# Patient Record
Sex: Female | Born: 1955 | Hispanic: No | Marital: Married | Smoking: Never smoker
Health system: Southern US, Community
[De-identification: ages and names within clinical notes are randomized; demographics above are authoritative.]

## PROBLEM LIST (undated history)

## (undated) DIAGNOSIS — I10 Essential (primary) hypertension: Secondary | ICD-10-CM

## (undated) DIAGNOSIS — I6529 Occlusion and stenosis of unspecified carotid artery: Secondary | ICD-10-CM

## (undated) DIAGNOSIS — Z8371 Family history of colonic polyps: Secondary | ICD-10-CM

## (undated) DIAGNOSIS — E119 Type 2 diabetes mellitus without complications: Secondary | ICD-10-CM

## (undated) DIAGNOSIS — Z83719 Family history of colon polyps, unspecified: Secondary | ICD-10-CM

## (undated) DIAGNOSIS — D649 Anemia, unspecified: Secondary | ICD-10-CM

## (undated) HISTORY — PX: CAROTID STENT INSERTION: SHX5766

## (undated) HISTORY — PX: OVARIAN CYST REMOVAL: SHX89

## (undated) HISTORY — DX: Occlusion and stenosis of unspecified carotid artery: I65.29

## (undated) HISTORY — DX: Family history of colon polyps, unspecified: Z83.719

## (undated) HISTORY — DX: Family history of colonic polyps: Z83.71

---

## 2002-02-26 ENCOUNTER — Other Ambulatory Visit: Admission: RE | Admit: 2002-02-26 | Discharge: 2002-02-26 | Payer: Self-pay | Admitting: Obstetrics and Gynecology

## 2014-10-27 HISTORY — PX: COLONOSCOPY: SHX174

## 2017-07-17 ENCOUNTER — Ambulatory Visit: Payer: Self-pay | Admitting: Allergy

## 2020-02-14 ENCOUNTER — Encounter (HOSPITAL_COMMUNITY): Payer: Self-pay | Admitting: Emergency Medicine

## 2020-02-14 ENCOUNTER — Inpatient Hospital Stay (HOSPITAL_COMMUNITY)
Admission: EM | Admit: 2020-02-14 | Discharge: 2020-02-18 | DRG: 811 | Disposition: A | Discharge status: Home / Self Care | Payer: Medicare Other | Attending: Family Medicine | Admitting: Family Medicine

## 2020-02-14 ENCOUNTER — Other Ambulatory Visit: Payer: Self-pay

## 2020-02-14 DIAGNOSIS — K5731 Diverticulosis of large intestine without perforation or abscess with bleeding: Secondary | ICD-10-CM | POA: Diagnosis present

## 2020-02-14 DIAGNOSIS — Z7902 Long term (current) use of antithrombotics/antiplatelets: Secondary | ICD-10-CM | POA: Diagnosis not present

## 2020-02-14 DIAGNOSIS — Z955 Presence of coronary angioplasty implant and graft: Secondary | ICD-10-CM

## 2020-02-14 DIAGNOSIS — I6521 Occlusion and stenosis of right carotid artery: Secondary | ICD-10-CM | POA: Diagnosis not present

## 2020-02-14 DIAGNOSIS — E119 Type 2 diabetes mellitus without complications: Secondary | ICD-10-CM | POA: Diagnosis not present

## 2020-02-14 DIAGNOSIS — K648 Other hemorrhoids: Secondary | ICD-10-CM | POA: Diagnosis present

## 2020-02-14 DIAGNOSIS — N179 Acute kidney failure, unspecified: Secondary | ICD-10-CM | POA: Diagnosis present

## 2020-02-14 DIAGNOSIS — E785 Hyperlipidemia, unspecified: Secondary | ICD-10-CM | POA: Diagnosis present

## 2020-02-14 DIAGNOSIS — Z7984 Long term (current) use of oral hypoglycemic drugs: Secondary | ICD-10-CM | POA: Diagnosis not present

## 2020-02-14 DIAGNOSIS — E869 Volume depletion, unspecified: Secondary | ICD-10-CM | POA: Diagnosis present

## 2020-02-14 DIAGNOSIS — Z888 Allergy status to other drugs, medicaments and biological substances status: Secondary | ICD-10-CM

## 2020-02-14 DIAGNOSIS — Z885 Allergy status to narcotic agent status: Secondary | ICD-10-CM

## 2020-02-14 DIAGNOSIS — R195 Other fecal abnormalities: Secondary | ICD-10-CM

## 2020-02-14 DIAGNOSIS — Z6841 Body Mass Index (BMI) 40.0 and over, adult: Secondary | ICD-10-CM

## 2020-02-14 DIAGNOSIS — D75839 Thrombocytosis, unspecified: Secondary | ICD-10-CM | POA: Diagnosis present

## 2020-02-14 DIAGNOSIS — I2510 Atherosclerotic heart disease of native coronary artery without angina pectoris: Secondary | ICD-10-CM | POA: Insufficient documentation

## 2020-02-14 DIAGNOSIS — D62 Acute posthemorrhagic anemia: Secondary | ICD-10-CM | POA: Diagnosis present

## 2020-02-14 DIAGNOSIS — D122 Benign neoplasm of ascending colon: Secondary | ICD-10-CM

## 2020-02-14 DIAGNOSIS — Z7982 Long term (current) use of aspirin: Secondary | ICD-10-CM

## 2020-02-14 DIAGNOSIS — I10 Essential (primary) hypertension: Secondary | ICD-10-CM | POA: Diagnosis present

## 2020-02-14 DIAGNOSIS — Z79899 Other long term (current) drug therapy: Secondary | ICD-10-CM

## 2020-02-14 DIAGNOSIS — K5520 Angiodysplasia of colon without hemorrhage: Secondary | ICD-10-CM | POA: Diagnosis not present

## 2020-02-14 DIAGNOSIS — Z20822 Contact with and (suspected) exposure to covid-19: Secondary | ICD-10-CM | POA: Diagnosis present

## 2020-02-14 DIAGNOSIS — Z8371 Family history of colonic polyps: Secondary | ICD-10-CM | POA: Diagnosis not present

## 2020-02-14 DIAGNOSIS — K5521 Angiodysplasia of colon with hemorrhage: Secondary | ICD-10-CM | POA: Diagnosis present

## 2020-02-14 DIAGNOSIS — D500 Iron deficiency anemia secondary to blood loss (chronic): Secondary | ICD-10-CM

## 2020-02-14 DIAGNOSIS — D509 Iron deficiency anemia, unspecified: Secondary | ICD-10-CM | POA: Diagnosis not present

## 2020-02-14 DIAGNOSIS — D649 Anemia, unspecified: Secondary | ICD-10-CM | POA: Diagnosis not present

## 2020-02-14 DIAGNOSIS — K921 Melena: Secondary | ICD-10-CM | POA: Diagnosis present

## 2020-02-14 DIAGNOSIS — K635 Polyp of colon: Secondary | ICD-10-CM | POA: Diagnosis present

## 2020-02-14 HISTORY — DX: Essential (primary) hypertension: I10

## 2020-02-14 HISTORY — DX: Type 2 diabetes mellitus without complications: E11.9

## 2020-02-14 HISTORY — DX: Anemia, unspecified: D64.9

## 2020-02-14 LAB — COMPREHENSIVE METABOLIC PANEL
ALT: 17 U/L (ref 0–44)
AST: 22 U/L (ref 15–41)
Albumin: 3.7 g/dL (ref 3.5–5.0)
Alkaline Phosphatase: 63 U/L (ref 38–126)
Anion gap: 15 (ref 5–15)
BUN: 10 mg/dL (ref 8–23)
CO2: 20 mmol/L — ABNORMAL LOW (ref 22–32)
Calcium: 9.2 mg/dL (ref 8.9–10.3)
Chloride: 104 mmol/L (ref 98–111)
Creatinine, Ser: 1.02 mg/dL — ABNORMAL HIGH (ref 0.44–1.00)
GFR calc Af Amer: >60 mL/min (ref >60)
GFR calc non Af Amer: 58 mL/min — ABNORMAL LOW (ref >60)
Glucose, Bld: 141 mg/dL — ABNORMAL HIGH (ref 70–99)
Potassium: 3.9 mmol/L (ref 3.5–5.1)
Sodium: 139 mmol/L (ref 135–145)
Total Bilirubin: 0.5 mg/dL (ref 0.3–1.2)
Total Protein: 7.3 g/dL (ref 6.5–8.1)

## 2020-02-14 LAB — CBC
HCT: 28.2 % — ABNORMAL LOW (ref 36.0–46.0)
Hemoglobin: 7.9 g/dL — ABNORMAL LOW (ref 12.0–15.0)
MCH: 23.6 pg — ABNORMAL LOW (ref 26.0–34.0)
MCHC: 28 g/dL — ABNORMAL LOW (ref 30.0–36.0)
MCV: 84.2 fL (ref 80.0–100.0)
Platelets: 549 10*3/uL — ABNORMAL HIGH (ref 150–400)
RBC: 3.35 MIL/uL — ABNORMAL LOW (ref 3.87–5.11)
RDW: 16.4 % — ABNORMAL HIGH (ref 11.5–15.5)
WBC: 7.7 10*3/uL (ref 4.0–10.5)
nRBC: 0 % (ref 0.0–0.2)

## 2020-02-14 LAB — URINALYSIS, ROUTINE W REFLEX MICROSCOPIC
Bilirubin Urine: NEGATIVE
Glucose, UA: NEGATIVE mg/dL
Hgb urine dipstick: NEGATIVE
Ketones, ur: NEGATIVE mg/dL
Leukocytes,Ua: NEGATIVE
Nitrite: NEGATIVE
Protein, ur: NEGATIVE mg/dL
Specific Gravity, Urine: 1.006 (ref 1.005–1.030)
pH: 6 (ref 5.0–8.0)

## 2020-02-14 LAB — IRON AND TIBC
Iron: 12 ug/dL — ABNORMAL LOW (ref 28–170)
Saturation Ratios: 3 % — ABNORMAL LOW (ref 10.4–31.8)
TIBC: 431 ug/dL (ref 250–450)
UIBC: 419 ug/dL

## 2020-02-14 LAB — GLUCOSE, CAPILLARY: Glucose-Capillary: 111 mg/dL — ABNORMAL HIGH (ref 70–99)

## 2020-02-14 LAB — HEMOGLOBIN A1C
Hgb A1c MFr Bld: 5.1 % (ref 4.8–5.6)
Mean Plasma Glucose: 99.67 mg/dL

## 2020-02-14 LAB — POC OCCULT BLOOD, ED: Fecal Occult Bld: POSITIVE — AB

## 2020-02-14 LAB — MAGNESIUM: Magnesium: 2 mg/dL (ref 1.7–2.4)

## 2020-02-14 LAB — HIV ANTIBODY (ROUTINE TESTING W REFLEX): HIV Screen 4th Generation wRfx: NONREACTIVE

## 2020-02-14 LAB — APTT: aPTT: 23 seconds — ABNORMAL LOW (ref 24–36)

## 2020-02-14 LAB — FERRITIN: Ferritin: 7 ng/mL — ABNORMAL LOW (ref 11–307)

## 2020-02-14 LAB — CBG MONITORING, ED: Glucose-Capillary: 105 mg/dL — ABNORMAL HIGH (ref 70–99)

## 2020-02-14 LAB — PROTIME-INR
INR: 1 (ref 0.8–1.2)
Prothrombin Time: 13.2 seconds (ref 11.4–15.2)

## 2020-02-14 LAB — ABO/RH: ABO/RH(D): B POS

## 2020-02-14 LAB — PREPARE RBC (CROSSMATCH)

## 2020-02-14 LAB — SARS CORONAVIRUS 2 BY RT PCR (HOSPITAL ORDER, PERFORMED IN ~~LOC~~ HOSPITAL LAB): SARS Coronavirus 2: NEGATIVE

## 2020-02-14 MED ORDER — ONDANSETRON HCL 4 MG/2ML IJ SOLN: 4.0000 mg | Freq: Four times a day (QID) | INTRAMUSCULAR | Status: DC | PRN

## 2020-02-14 MED ORDER — ONDANSETRON HCL 4 MG PO TABS: 4.0000 mg | ORAL_TABLET | Freq: Four times a day (QID) | ORAL | Status: DC | PRN

## 2020-02-14 MED ORDER — SODIUM CHLORIDE 0.9 % IV SOLN: 10.0000 mL/h | Freq: Once | INTRAVENOUS | Status: DC

## 2020-02-14 MED ORDER — PANTOPRAZOLE SODIUM 40 MG IV SOLR: 40.0000 mg | Freq: Two times a day (BID) | INTRAVENOUS | Status: DC

## 2020-02-14 MED ORDER — INSULIN ASPART 100 UNIT/ML ~~LOC~~ SOLN: 0.0000 [IU] | Freq: Every day | SUBCUTANEOUS | Status: DC

## 2020-02-14 MED ORDER — ACETAMINOPHEN 325 MG PO TABS
650.0000 mg | ORAL_TABLET | Freq: Four times a day (QID) | ORAL | Status: DC | PRN
  Filled 2020-02-14: qty 2

## 2020-02-14 MED ORDER — PANTOPRAZOLE SODIUM 40 MG IV SOLR
40.0000 mg | Freq: Two times a day (BID) | INTRAVENOUS | Status: DC
  Administered 2020-02-15: 40 mg via INTRAVENOUS
  Filled 2020-02-14: qty 40

## 2020-02-14 MED ORDER — ACETAMINOPHEN 650 MG RE SUPP: 650.0000 mg | Freq: Four times a day (QID) | RECTAL | Status: DC | PRN

## 2020-02-14 MED ORDER — INSULIN ASPART 100 UNIT/ML ~~LOC~~ SOLN: 0.0000 [IU] | Freq: Three times a day (TID) | SUBCUTANEOUS | Status: DC

## 2020-02-14 MED ORDER — PANTOPRAZOLE SODIUM 40 MG IV SOLR
40.0000 mg | Freq: Once | INTRAVENOUS | Status: AC
  Administered 2020-02-14: 40 mg via INTRAVENOUS
  Filled 2020-02-14: qty 40

## 2020-02-14 NOTE — H&P (View-Only) (Signed)
Referring Provider:  [facility]         Primary Care Physician:  Patient, No Pcp Per Primary Gastroenterologist:  Carmell Austria, MD           We were asked to see this patient for:  GI bleed                ASSESSMENT /  PLAN    Natasha Osborn is a 64 y.o. female PMH significant for, but not necessarily limited to,   diabetes , hyperlipidemia, obesity,  CAD,  carotid stenosis s/p right stent in May                                                                                                                            # Chronic iron deficiency anemia, now with heme + dark stools on Plavix and ASA --Chronic IDA of unknown etiology ( ? No prior workup). On chronic iron.  --Sent to ED by PCP with ~ 5 gram decline in hgb since April and dark stools since May when carotid stent was placed and she started plavix.   --BID PPI --She will need an EGD for what is most likely an UGI bleed. Will likely be done tomorrow. The risks and benefits of EGD were discussed and the patient agrees to proceed.  --ED has ordered a unit of PRBC --Colonoscopy 10-22-2014 ( screening). Two 6 mm polyps removed. Path not available but 5 year recall recommended due to strong family history of colon polyps. Will plan for outpatient colonoscopy unless hospital course dictates otherwise ( negative EGD)  # Dual anti-platelet therapy with plavix / asa for carotid artery stent  -Hold Plavix    HPI:    Chief Complaint: black stools and anemia  Natasha Osborn is a 64 y.o. female with PMH as above who was sent by PCP to ED for decline in hgb. She had a carotid stent placed in May and on plavix since. She was taking a daily baby asa. No other NSAIDS. Not on PPI at home. No previous history of overt GI bleeding. She is on chronic iron which keeps her stools dark green but in May after starting Plavix they turned black. She has no abdominal pain, N/V. No weight loss. She has felt tired but no chest pain nor significant SOB.  Patient says her iron deficiency anemia has never been worked up. She denies [facility] of stomach or colon cancer. Says she is due for colonoscopy but couldn't get it done because of the stent placement.   Data Reviewed  Hgb mid April 2021 was 12, yesterday at PCP's it was 7.7 with MCV of 79. Ferritin 17  In ED today hgb is 7.9, platelets 549 Bun 10 LFTs normal   PREVIOUS ENDOSCOPIC EVALUATIONS / GI STUDIES :  Screening colonoscopy 2014/10/22 - Dr. Lyndel Safe -Two 6 mm polyps removed. Path not available but 5 year recall recommended due to strong family history of colon  polyps.  Past Medical History:  Diagnosis Date   Anemia    Diabetes mellitus without complication ([place])    Hypertension     Past Surgical History:  Procedure Laterality Date   CAROTID STENT INSERTION      Prior to Admission medications   Medication Sig Start Date End Date Taking? Authorizing Provider  clopidogrel (PLAVIX) 75 MG tablet Take 75 mg by mouth daily. 01/11/20   [provider]  ferrous sulfate 325 (65 FE) MG tablet Take 235 mg by mouth daily.    [provider]  hydrochlorothiazide (HYDRODIURIL) 25 MG tablet Take 25 mg by mouth daily. 01/01/20   [provider]  lisinopril (ZESTRIL) 20 MG tablet Take 20 mg by mouth 2 (two) times daily. 01/01/20   [provider]  metFORMIN (GLUCOPHAGE-XR) 500 MG 24 hr tablet Take 1,000 mg by mouth 2 (two) times daily. 01/29/20   [provider]  pravastatin (PRAVACHOL) 20 MG tablet Take 20 mg by mouth daily. 01/01/20   [provider]  TRADJENTA 5 MG TABS tablet Take 5 mg by mouth daily. 11/13/19   [provider]    Current Facility-Administered Medications  Medication Dose Route Frequency Provider Last Rate Last Admin   0.9 %  sodium chloride infusion  10 mL/hr Intravenous Once Khatri, Hina, PA-C       pantoprazole (PROTONIX) injection 40 mg  40 mg Intravenous Once Khatri, Hina, PA-C       Current Outpatient  Medications  Medication Sig Dispense Refill   clopidogrel (PLAVIX) 75 MG tablet Take 75 mg by mouth daily.     ferrous sulfate 325 (65 FE) MG tablet Take 235 mg by mouth daily.     hydrochlorothiazide (HYDRODIURIL) 25 MG tablet Take 25 mg by mouth daily.     lisinopril (ZESTRIL) 20 MG tablet Take 20 mg by mouth 2 (two) times daily.     metFORMIN (GLUCOPHAGE-XR) 500 MG 24 hr tablet Take 1,000 mg by mouth 2 (two) times daily.     pravastatin (PRAVACHOL) 20 MG tablet Take 20 mg by mouth daily.     TRADJENTA 5 MG TABS tablet Take 5 mg by mouth daily.      Allergies as of 02/14/2020 - Review Complete 02/14/2020  Allergen Reaction Noted   Codeine Nausea And Vomiting 02/14/2020   Glipizide Other (See Comments) 08/30/2015   Levaquin [levofloxacin] Nausea Only 02/14/2020    No family history on file.  Social History   Socioeconomic History   Marital status: Married    Spouse name: Not on file   Number of children: Not on file   Years of education: Not on file   Highest education level: Not on file  Occupational History   Not on file  Tobacco Use   Smoking status: Not on file  Substance and Sexual Activity   Alcohol use: Not on file   Drug use: Not on file   Sexual activity: Not on file  Other Topics Concern   Not on file  Social History Narrative   Not on file   Social Determinants of Health   Financial Resource Strain:    Difficulty of Paying Living Expenses:   Food Insecurity:    Worried About [place] in the Last Year:    [place] in the Last Year:   Transportation Needs:    Lack of Transportation (Medical):    Lack of Transportation (Non-Medical):   Physical Activity:    Days of  Exercise per Week:    Minutes of Exercise per Session:   Stress:    Feeling of Stress :   Social Connections:    Frequency of Communication with Friends and Family:    Frequency of Social Gatherings with Friends and Family:    Attends  Religious Services:    Active Member of Clubs or Organizations:    Attends Music therapist:    Marital Status:   Intimate Partner Violence:    Fear of Current or Ex-Partner:    Emotionally Abused:    Physically Abused:    Sexually Abused:     Review of Systems: All systems reviewed and negative except where noted in HPI.  Physical Exam: Vital signs in last 24 hours: Temp:  [98.1 F (36.7 C)] 98.1 F (36.7 C) (08/06 1144) Pulse Rate:  [100] 100 (08/06 1144) Resp:  [18] 18 (08/06 1144) BP: (166)/(77) 166/77 (08/06 1144) SpO2:  [100 %] 100 % (08/06 1144) Weight:  [117.9 kg] 117.9 kg (08/06 1144)   General:   Alert, obese female in NAD Psych:  Pleasant, cooperative. Normal mood and affect. Eyes:  Pupils equal, sclera clear, no icterus.   Conjunctiva pink. Ears:  Normal auditory acuity. Nose:  No deformity, discharge,  or lesions. Neck:  Supple; no masses Lungs:  Clear throughout to auscultation.   No wheezes, crackles, or rhonchi.  Heart:  Regular rate and rhythm; no murmurs, no lower extremity edema Abdomen:  Soft, non-distended, nontender, BS active, no palp mass   Rectal:  Deferred. Performed by EDP (melena) Msk:  Symmetrical without gross deformities. . Neurologic:  Alert and  oriented x4;  grossly normal neurologically. Skin:  Intact without significant lesions or rashes.   Intake/Output from previous day: No intake/output data recorded. Intake/Output this shift: No intake/output data recorded.  Lab Results: Recent Labs    02/14/20 1159  WBC 7.7  HGB 7.9*  HCT 28.2*  PLT 549*   BMET Recent Labs    02/14/20 1159  NA 139  K 3.9  CL 104  CO2 20*  GLUCOSE 141*  BUN 10  CREATININE 1.02*  CALCIUM 9.2   LFT Recent Labs    02/14/20 1159  PROT 7.3  ALBUMIN 3.7  AST 22  ALT 17  ALKPHOS 63  BILITOT 0.5     . CBC Latest Ref Rng & Units 02/14/2020  WBC 4.0 - 10.5 K/uL 7.7  Hemoglobin 12.0 - 15.0 g/dL 7.9(L)  Hematocrit 36 - 46  % 28.2(L)  Platelets 150 - 400 K/uL 549(H)    . CMP Latest Ref Rng & Units 02/14/2020  Glucose 70 - 99 mg/dL 141(H)  BUN 8 - 23 mg/dL 10  Creatinine 0.44 - 1.00 mg/dL 1.02(H)  Sodium 135 - 145 mmol/L 139  Potassium 3.5 - 5.1 mmol/L 3.9  Chloride 98 - 111 mmol/L 104  CO2 22 - 32 mmol/L 20(L)  Calcium 8.9 - 10.3 mg/dL 9.2  Total Protein 6.5 - 8.1 g/dL 7.3  Total Bilirubin 0.3 - 1.2 mg/dL 0.5  Alkaline Phos 38 - 126 U/L 63  AST 15 - 41 U/L 22  ALT 0 - 44 U/L 17   Studies/Results: No results found.  Active Problems:   * No active hospital problems. Tye Savoy, NP-C @  02/14/2020, 2:46 PM   ________________________________________________________________________  [facility] MD note:  I personally examined the patient, reviewed the data and agree with the assessment and plan described above.  SHe has had black  colored stools since May shortly after starting plavix following carotid stenting.  She was on BID baby ASA for several years prior to that and is still taking BID ASA. Not sure she needs to be on both of those antiplt meds.  Hb is 7.9, normocytic. We are planning EGD tomorrow to evaluate the chronic black stools. Dual antiplatelet therapy likely contributes and may need to be adjusted so that she is only taking either ASA or plavix going forward.  If no clear cause of her black stools on EGD then she understands she will probably need further testing with a colonoscopy after full plavix washout (last dose 8/5 PM).     Owens Loffler, MD [facility] Pager [phone]

## 2020-02-14 NOTE — ED Provider Notes (Signed)
[facility] EMERGENCY DEPARTMENT Provider Note   CSN: [id number] Arrival date & time: 02/14/20  1138     History Chief Complaint  Patient presents with  . Abnormal Lab    Natasha Osborn is a 64 y.o. female with a past medical history of hypertension, anemia on iron supplements, diabetes, status post carotid stent placement in April 2021 currently on baby aspirin twice a day and Plavix daily presenting to the ED with a chief complaint of abnormal lab work.  She was seen and evaluated by her PCP yesterday for routine checkup.  Her hemoglobin was checked and was found to be 7.7.  She was told to come to the ER.  States that she has been taking iron supplements for the past several years and her stools have been "greenish."  Ever since starting the Plavix she has noticed that they have become black.  She does report fatigue and generalized weakness since being on the Plavix but was told this was just a side effect of the medication.  She denies history of blood transfusion in the past.  Denies any abdominal pain, hematochezia, history of ulcers, hematemesis, nausea, vomiting, chest pain, shortness of breath, injuries or falls, headache, hematuria. Last colonoscopy was between 5 to 10 years ago, was told that she had polyps that were removed but no other abnormalities.  No prior endoscopies.  HPI     Past Medical History:  Diagnosis Date  . Anemia   . Diabetes mellitus without complication ([place])   . Hypertension     There are no problems to display for this patient.   Past Surgical History:  Procedure Laterality Date  . CAROTID STENT INSERTION       OB History   No obstetric history on file.     No family history on file.  Social History   Tobacco Use  . Smoking status: Not on file  Substance Use Topics  . Alcohol use: Not on file  . Drug use: Not on file    Home Medications Prior to Admission medications   Medication Sig Start Date End Date  Taking? Authorizing Provider  clopidogrel (PLAVIX) 75 MG tablet Take 75 mg by mouth daily. 01/11/20   [provider]  ferrous sulfate 325 (65 FE) MG tablet Take 235 mg by mouth daily.    [provider]  hydrochlorothiazide (HYDRODIURIL) 25 MG tablet Take 25 mg by mouth daily. 01/01/20   [provider]  lisinopril (ZESTRIL) 20 MG tablet Take 20 mg by mouth 2 (two) times daily. 01/01/20   [provider]  metFORMIN (GLUCOPHAGE-XR) 500 MG 24 hr tablet Take 1,000 mg by mouth 2 (two) times daily. 01/29/20   [provider]  pravastatin (PRAVACHOL) 20 MG tablet Take 20 mg by mouth daily. 01/01/20   [provider]  TRADJENTA 5 MG TABS tablet Take 5 mg by mouth daily. 11/13/19   [provider]    Allergies    Codeine, Glipizide, and Levaquin [levofloxacin]  Review of Systems   Review of Systems  Constitutional: Positive for fatigue. Negative for appetite change, chills and fever.  HENT: Negative for ear pain, rhinorrhea, sneezing and sore throat.   Eyes: Negative for photophobia and visual disturbance.  Respiratory: Negative for cough, chest tightness, shortness of breath and wheezing.   Cardiovascular: Negative for chest pain and palpitations.  Gastrointestinal: Positive for blood in stool. Negative for abdominal pain, constipation, diarrhea, nausea and vomiting.  Genitourinary: Negative for dysuria, hematuria  and urgency.  Musculoskeletal: Negative for myalgias.  Skin: Negative for rash.  Neurological: Negative for dizziness, weakness and light-headedness.    Physical Exam Updated Vital Signs BP (!) 166/77 (BP Location: Right Arm)   Pulse 100   Temp 98.1 F (36.7 C) (Oral)   Resp 18   Ht 5\' 1"  (1.549 m)   Wt 117.9 kg   SpO2 100%   BMI 49.13 kg/m   Physical Exam Vitals and nursing note reviewed. Exam conducted with a chaperone present.  Constitutional:      General: She is not in acute distress.    Appearance: She is  well-developed.     Comments: No acute distress noted.  HENT:     Head: Normocephalic and atraumatic.     Nose: Nose normal.  Eyes:     General: No scleral icterus.       Right eye: No discharge.        Left eye: No discharge.     Conjunctiva/sclera: Conjunctivae normal.  Cardiovascular:     Rate and Rhythm: Normal rate and regular rhythm.     Heart sounds: Normal heart sounds. No murmur heard.  No friction rub. No gallop.   Pulmonary:     Effort: Pulmonary effort is normal. No respiratory distress.     Breath sounds: Normal breath sounds.  Abdominal:     General: Bowel sounds are normal. There is no distension.     Palpations: Abdomen is soft.     Tenderness: There is no abdominal tenderness. There is no guarding.  Genitourinary:    Comments: Tach served as chaperone in exam.  Black stool noted in rectal vault without tenderness. Musculoskeletal:        General: Normal range of motion.     Cervical back: Normal range of motion and neck supple.  Skin:    General: Skin is warm and dry.     Findings: No rash.  Neurological:     Mental Status: She is alert.     Motor: No abnormal muscle tone.     Coordination: Coordination normal.     ED Results / Procedures / Treatments   Labs (all labs ordered are listed, but only abnormal results are displayed) Labs Reviewed  COMPREHENSIVE METABOLIC PANEL - Abnormal; Notable for the following components:      Result Value   CO2 20 (*)    Glucose, Bld 141 (*)    Creatinine, Ser 1.02 (*)    GFR calc non Af Amer 58 (*)    All other components within normal limits  CBC - Abnormal; Notable for the following components:   RBC 3.35 (*)    Hemoglobin 7.9 (*)    HCT 28.2 (*)    MCH 23.6 (*)    MCHC 28.0 (*)    RDW 16.4 (*)    Platelets 549 (*)    All other components within normal limits  POC OCCULT BLOOD, ED - Abnormal; Notable for the following components:   Fecal Occult Bld POSITIVE (*)    All other components within normal limits    SARS CORONAVIRUS 2 BY RT PCR (HOSPITAL ORDER, [facility])  TYPE AND SCREEN  ABO/RH  PREPARE RBC (CROSSMATCH)    EKG None  Radiology No results found.  Procedures .Critical Care Performed by: Delia Heady, PA-C Authorized by: Delia Heady, PA-C   Critical care provider statement:    Critical care time (minutes):  35   Critical care was necessary to  treat or prevent imminent or life-threatening deterioration of the following conditions:  Circulatory failure and shock   Critical care was time spent personally by me on the following activities:  Development of treatment plan with patient or surrogate, discussions with consultants, evaluation of patient's response to treatment, examination of patient, ordering and performing treatments and interventions, ordering and review of laboratory studies, re-evaluation of patient's condition, review of old charts and obtaining history from patient or surrogate   I assumed direction of critical care for this patient from another provider in my specialty: no     (including critical care time)  Medications Ordered in ED Medications  0.9 %  sodium chloride infusion (has no administration in time range)  pantoprazole (PROTONIX) injection 40 mg (has no administration in time range)    ED Course  I have reviewed the triage vital signs and the nursing notes.  Pertinent labs & imaging results that were available during my care of the patient were reviewed by me and considered in my medical decision making (see chart for details).  Clinical Course as of Feb 13 1450  Fri Feb 14, 2020  1301 Chart review shows that patient's hemoglobin in April 2021 was 12; baseline appears around 11.  Hemoglobin(!): 7.9 [HK]  1349 Fecal Occult Blood, POC(!): POSITIVE [HK]  1354 Chart review shows that colonoscopy with 2016 with polyps.   [HK]  1429 Spoke to [facility] who recommends patient be admitted to the hospitalist service and  they will see the patient in consult.   [HK]    Clinical Course User Index [HK] Lulu Riding   MDM Rules/Calculators/A&P                          64 year old female with a past medical history of hypertension, anemia on iron supplementation, diabetes, status post carotid stent placement in April 2021 currently on Plavix and aspirin presenting to the ED for abnormal lab work.  Had lab work done by PCP yesterday which showed hemoglobin of 7.7.  Last hemoglobin was 12 in April 2021.  She did undergo carotid stent placement at the time.  She states that since then she has noticed black stools, generalized fatigue and weakness.  She was told this was due to the Plavix.  Her last endoscopy was in 2016, no endoscopies in the past.  She denies any abdominal pain, history of ulcers, hematemesis, fever, chest pain, shortness of breath, injuries or falls.  On exam abdomen is soft, nontender nondistended.  Rectal exam reveals black stool.  Hemoccult positive.  Lab work here shows hemoglobin of 7.9.  Chart review shows that patient's baseline is around 11.  I spoke to [facility] who recommends admission to medicine service, she is high risk due to being on Plavix.  Patient is agreeable to this.  We will begin Protonix, give 1 unit of blood and admit to medicine service.  I suspect that her GI bleed is in the setting of her Protonix use.  She remains hemodynamically stable here.   Portions of this note were generated with [employer]. Dictation errors may occur despite best attempts at proofreading.  Final Clinical Impression(s) / ED Diagnoses Final diagnoses:  Symptomatic anemia  Gastrointestinal hemorrhage with melena    Rx / DC Orders ED Discharge Orders    None       Delia Heady, PA-C 02/14/20 1451    Daleen Bo, MD 02/14/20 1615

## 2020-02-14 NOTE — ED Notes (Signed)
Pt tolerated ortho vs well. Pt denied feeling dizzy, unsteady, lightheaded, or any changes in vision. Results reported to PA.

## 2020-02-14 NOTE — ED Provider Notes (Signed)
  Face-to-face evaluation   History: She presents for evaluation of low hemoglobin.  She reports ongoing dark stool, during treatment with Plavix, following carotid stenting, in April 2021.  Physical exam: Alert obese female who is calm comfortable.  Abdomen soft nontender.  No dysarthria or aphasia.  She is lucid.  Medical screening examination/treatment/procedure(s) were conducted as a shared visit with non-physician practitioner(s) and myself.  I personally evaluated the patient during the encounter    Daleen Bo, MD 02/14/20 [phone]

## 2020-02-14 NOTE — Consult Note (Addendum)
Referring Provider:  [facility]         Primary Care Physician:  Patient, No Pcp Per Primary Gastroenterologist:  Carmell Austria, MD           We were asked to see this patient for:  GI bleed                ASSESSMENT /  PLAN    Natasha Osborn is a 64 y.o. female PMH significant for, but not necessarily limited to,   diabetes , hyperlipidemia, obesity,  CAD,  carotid stenosis s/p right stent in May                                                                                                                            # Chronic iron deficiency anemia, now with heme + dark stools on Plavix and ASA --Chronic IDA of unknown etiology ( ? No prior workup). On chronic iron.  --Sent to ED by PCP with ~ 5 gram decline in hgb since April and dark stools since May when carotid stent was placed and she started plavix.   --BID PPI --She will need an EGD for what is most likely an UGI bleed. Will likely be done tomorrow. The risks and benefits of EGD were discussed and the patient agrees to proceed.  --ED has ordered a unit of PRBC --Colonoscopy 10/21/2014 ( screening). Two 6 mm polyps removed. Path not available but 5 year recall recommended due to strong family history of colon polyps. Will plan for outpatient colonoscopy unless hospital course dictates otherwise ( negative EGD)  # Dual anti-platelet therapy with plavix / asa for carotid artery stent  -Hold Plavix    HPI:    Chief Complaint: black stools and anemia  [name] is a 64 y.o. female with PMH as above who was sent by PCP to ED for decline in hgb. She had a carotid stent placed in May and on plavix since. She was taking a daily baby asa. No other NSAIDS. Not on PPI at home. No previous history of overt GI bleeding. She is on chronic iron which keeps her stools dark green but in May after starting Plavix they turned black. She has no abdominal pain, N/V. No weight loss. She has felt tired but no chest pain nor significant SOB.  Patient says her iron deficiency anemia has never been worked up. She denies [facility] of stomach or colon cancer. Says she is due for colonoscopy but couldn't get it done because of the stent placement.   Data Reviewed  Hgb mid April 2021 was 12, yesterday at PCP's it was 7.7 with MCV of 79. Ferritin 17  In ED today hgb is 7.9, platelets 549 Bun 10 LFTs normal   PREVIOUS ENDOSCOPIC EVALUATIONS / GI STUDIES :  Screening colonoscopy Oct 21, 2014 - Dr. Lyndel Safe -Two 6 mm polyps removed. Path not available but 5 year recall recommended due to strong family history of colon  polyps.  Past Medical History:  Diagnosis Date  . Anemia   . Diabetes mellitus without complication ([place])   . Hypertension     Past Surgical History:  Procedure Laterality Date  . CAROTID STENT INSERTION      Prior to Admission medications   Medication Sig Start Date End Date Taking? Authorizing Provider  clopidogrel (PLAVIX) 75 MG tablet Take 75 mg by mouth daily. 01/11/20   [provider]  ferrous sulfate 325 (65 FE) MG tablet Take 235 mg by mouth daily.    [provider]  hydrochlorothiazide (HYDRODIURIL) 25 MG tablet Take 25 mg by mouth daily. 01/01/20   [provider]  lisinopril (ZESTRIL) 20 MG tablet Take 20 mg by mouth 2 (two) times daily. 01/01/20   [provider]  metFORMIN (GLUCOPHAGE-XR) 500 MG 24 hr tablet Take 1,000 mg by mouth 2 (two) times daily. 01/29/20   [provider]  pravastatin (PRAVACHOL) 20 MG tablet Take 20 mg by mouth daily. 01/01/20   [provider]  TRADJENTA 5 MG TABS tablet Take 5 mg by mouth daily. 11/13/19   [provider]    Current Facility-Administered Medications  Medication Dose Route Frequency Provider Last Rate Last Admin  . 0.9 %  sodium chloride infusion  10 mL/hr Intravenous Once Khatri, Hina, PA-C      . pantoprazole (PROTONIX) injection 40 mg  40 mg Intravenous Once Khatri, Hina, PA-C       Current Outpatient  Medications  Medication Sig Dispense Refill  . clopidogrel (PLAVIX) 75 MG tablet Take 75 mg by mouth daily.    . ferrous sulfate 325 (65 FE) MG tablet Take 235 mg by mouth daily.    . hydrochlorothiazide (HYDRODIURIL) 25 MG tablet Take 25 mg by mouth daily.    Marland Kitchen lisinopril (ZESTRIL) 20 MG tablet Take 20 mg by mouth 2 (two) times daily.    . metFORMIN (GLUCOPHAGE-XR) 500 MG 24 hr tablet Take 1,000 mg by mouth 2 (two) times daily.    . pravastatin (PRAVACHOL) 20 MG tablet Take 20 mg by mouth daily.    . TRADJENTA 5 MG TABS tablet Take 5 mg by mouth daily.      Allergies as of 02/14/2020 - Review Complete 02/14/2020  Allergen Reaction Noted  . Codeine Nausea And Vomiting 02/14/2020  . Glipizide Other (See Comments) 08/30/2015  . Levaquin [levofloxacin] Nausea Only 02/14/2020    No family history on file.  Social History   Socioeconomic History  . Marital status: Married    Spouse name: Not on file  . Number of children: Not on file  . Years of education: Not on file  . Highest education level: Not on file  Occupational History  . Not on file  Tobacco Use  . Smoking status: Not on file  Substance and Sexual Activity  . Alcohol use: Not on file  . Drug use: Not on file  . Sexual activity: Not on file  Other Topics Concern  . Not on file  Social History Narrative  . Not on file   Social Determinants of Health   Financial Resource Strain:   . Difficulty of Paying Living Expenses:   Food Insecurity:   . Worried About Charity fundraiser in the Last Year:   . Arboriculturist in the Last Year:   Transportation Needs:   . Film/video editor (Medical):   Marland Kitchen Lack of Transportation (Non-Medical):   Physical Activity:   . Days of  Exercise per Week:   . Minutes of Exercise per Session:   Stress:   . Feeling of Stress :   Social Connections:   . Frequency of Communication with Friends and Family:   . Frequency of Social Gatherings with Friends and Family:   . Attends  Religious Services:   . Active Member of Clubs or Organizations:   . Attends Archivist Meetings:   Marland Kitchen Marital Status:   Intimate Partner Violence:   . Fear of Current or Ex-Partner:   . Emotionally Abused:   Marland Kitchen Physically Abused:   . Sexually Abused:     Review of Systems: All systems reviewed and negative except where noted in HPI.  Physical Exam: Vital signs in last 24 hours: Temp:  [98.1 F (36.7 C)] 98.1 F (36.7 C) (08/06 1144) Pulse Rate:  [100] 100 (08/06 1144) Resp:  [18] 18 (08/06 1144) BP: (166)/(77) 166/77 (08/06 1144) SpO2:  [100 %] 100 % (08/06 1144) Weight:  [117.9 kg] 117.9 kg (08/06 1144)   General:   Alert, obese female in NAD Psych:  Pleasant, cooperative. Normal mood and affect. Eyes:  Pupils equal, sclera clear, no icterus.   Conjunctiva pink. Ears:  Normal auditory acuity. Nose:  No deformity, discharge,  or lesions. Neck:  Supple; no masses Lungs:  Clear throughout to auscultation.   No wheezes, crackles, or rhonchi.  Heart:  Regular rate and rhythm; no murmurs, no lower extremity edema Abdomen:  Soft, non-distended, nontender, BS active, no palp mass   Rectal:  Deferred. Performed by EDP (melena) Msk:  Symmetrical without gross deformities. . Neurologic:  Alert and  oriented x4;  grossly normal neurologically. Skin:  Intact without significant lesions or rashes.   Intake/Output from previous day: No intake/output data recorded. Intake/Output this shift: No intake/output data recorded.  Lab Results: Recent Labs    02/14/20 1159  WBC 7.7  HGB 7.9*  HCT 28.2*  PLT 549*   BMET Recent Labs    02/14/20 1159  NA 139  K 3.9  CL 104  CO2 20*  GLUCOSE 141*  BUN 10  CREATININE 1.02*  CALCIUM 9.2   LFT Recent Labs    02/14/20 1159  PROT 7.3  ALBUMIN 3.7  AST 22  ALT 17  ALKPHOS 63  BILITOT 0.5     . CBC Latest Ref Rng & Units 02/14/2020  WBC 4.0 - 10.5 K/uL 7.7  Hemoglobin 12.0 - 15.0 g/dL 7.9(L)  Hematocrit 36 - 46  % 28.2(L)  Platelets 150 - 400 K/uL 549(H)    . CMP Latest Ref Rng & Units 02/14/2020  Glucose 70 - 99 mg/dL 141(H)  BUN 8 - 23 mg/dL 10  Creatinine 0.44 - 1.00 mg/dL 1.02(H)  Sodium 135 - 145 mmol/L 139  Potassium 3.5 - 5.1 mmol/L 3.9  Chloride 98 - 111 mmol/L 104  CO2 22 - 32 mmol/L 20(L)  Calcium 8.9 - 10.3 mg/dL 9.2  Total Protein 6.5 - 8.1 g/dL 7.3  Total Bilirubin 0.3 - 1.2 mg/dL 0.5  Alkaline Phos 38 - 126 U/L 63  AST 15 - 41 U/L 22  ALT 0 - 44 U/L 17   Studies/Results: No results found.  Active Problems:   * No active hospital problems. Tye Savoy, NP-C @  02/14/2020, 2:46 PM   ________________________________________________________________________  Velora Heckler GI MD note:  I personally examined the patient, reviewed the data and agree with the assessment and plan described above.  SHe has had black  colored stools since May shortly after starting plavix following carotid stenting.  She was on BID baby ASA for several years prior to that and is still taking BID ASA. Not sure she needs to be on both of those antiplt meds.  Hb is 7.9, normocytic. We are planning EGD tomorrow to evaluate the chronic black stools. Dual antiplatelet therapy likely contributes and may need to be adjusted so that she is only taking either ASA or plavix going forward.  If no clear cause of her black stools on EGD then she understands she will probably need further testing with a colonoscopy after full plavix washout (last dose 8/5 PM).     Owens Loffler, MD [facility] Pager [phone]

## 2020-02-14 NOTE — H&P (Signed)
History and Physical    Natasha Osborn:[id number] DOB: [birth year] DOA: 02/14/2020  PCP: Patient, No Pcp Per  Patient coming from: home I have personally briefly reviewed patient's old medical records in Natasha Osborn  Chief Complaint: Low hemoglobin  HPI: Natasha Osborn is a 64 y.o. female with medical history significant of hypertension, hyperlipidemia, diabetes mellitus, morbid obesity, right carotid artery stenosis status post stents placement in May 2021 on aspirin and Plavix presents to emergency department due to low hemoglobin.  Patient tells me that she had routine follow-up with her PCP and was found to have low hemoglobin of 7 therefore she was asked to go to the ER for further evaluation and management.  Her last hemoglobin was 12.0 which was checked in 10/25/2019.  She takes iron supplements every day.  She tells me that she had stent placement in right carotid artery in May 2021 and was placed on Plavix and since then she has noticed black stool.  Reports tiredness and generalized weakness however denies chest pain, shortness of breath, palpitation, leg swelling, fever, chills, nausea, vomiting, epigastric pain, over-the-counter use of NSAIDs, family history of stomach/colon cancer, night sweats, weight loss, decreased appetite.  She underwent colonoscopy in 2016 which shows polyps status post polypectomy.  Repeat colonoscopy recommended in 5 years.  She never had an EGD in the past.  She denies history of smoking, alcohol, licit drug use.  Her last dose of Plavix was last night.  ED Course: Upon arrival to ED: Patient's vital signs stable.  H&H was noted to be 7.9/28.2, platelet: 549, occult blood positive, COVID-19 pending.  1 unit PRBC ordered.  EDP consulted GI.  [facility] consulted for admission for symptomatic anemia.  Review of Systems: As per HPI otherwise negative.    Past Medical History:  Diagnosis Date  . Anemia   . Diabetes mellitus without  complication ([place])   . Hypertension     Past Surgical History:  Procedure Laterality Date  . CAROTID STENT INSERTION       has no history on file for tobacco use, alcohol use, and drug use.  Allergies  Allergen Reactions  . Codeine Nausea And Vomiting  . Glipizide Other (See Comments)    Reaction unknown  . Levaquin [Levofloxacin] Nausea Only    No family history on file.  Prior to Admission medications   Medication Sig Start Date End Date Taking? Authorizing Provider  aspirin 81 MG EC tablet Take 81 mg by mouth 2 (two) times daily.    [provider]  clopidogrel (PLAVIX) 75 MG tablet Take 75 mg by mouth daily. 01/11/20   [provider]  ferrous sulfate 325 (65 FE) MG tablet Take 235 mg by mouth daily.    [provider]  hydrochlorothiazide (HYDRODIURIL) 25 MG tablet Take 25 mg by mouth daily. 01/01/20   [provider]  lisinopril (ZESTRIL) 20 MG tablet Take 20 mg by mouth 2 (two) times daily. 01/01/20   [provider]  metFORMIN (GLUCOPHAGE-XR) 500 MG 24 hr tablet Take 1,000 mg by mouth 2 (two) times daily. 01/29/20   [provider]  pravastatin (PRAVACHOL) 20 MG tablet Take 20 mg by mouth daily. 01/01/20   [provider]  TRADJENTA 5 MG TABS tablet Take 5 mg by mouth daily. 11/13/19   [provider]    Physical Exam: Vitals:   02/14/20 1144 02/14/20 1456  BP: (!) 166/77 (!) 131/53  Pulse: 100 90  Resp: 18 18  Temp: 98.1 F (36.7 C)   TempSrc: Oral   SpO2: 100% 100%  Weight: 117.9 kg   Height: 5\' 1"  (1.549 m)     Constitutional: NAD, calm, comfortable, on room air, morbidly obese Eyes: PERRL, lids and conjunctivae: Pale ENMT: Mucous membranes are moist. Posterior pharynx clear of any exudate or lesions.Normal dentition.  Neck: normal, supple, no masses, no thyromegaly Respiratory: clear to auscultation bilaterally, no wheezing, no crackles. Normal respiratory effort. No accessory muscle use.    Cardiovascular: Regular rate and rhythm, no murmurs / rubs / gallops. No extremity edema. 2+ pedal pulses. No carotid bruits.  Abdomen: no tenderness, no masses palpated. No hepatosplenomegaly. Bowel sounds positive.  Musculoskeletal: no clubbing / cyanosis. No joint deformity upper and lower extremities. Good ROM, no contractures. Normal muscle tone.  Skin: no rashes, lesions, ulcers. No induration Neurologic: CN 2-12 grossly intact. Sensation intact, DTR normal. Strength 5/5 in all 4.  Psychiatric: Normal judgment and insight. Alert and oriented x 3. Normal mood.    Labs on Admission: I have personally reviewed following labs and imaging studies  CBC: Recent Labs  Lab 02/14/20 1159  WBC 7.7  HGB 7.9*  HCT 28.2*  MCV 84.2  PLT 347*   Basic Metabolic Panel: Recent Labs  Lab 02/14/20 1159  NA 139  K 3.9  CL 104  CO2 20*  GLUCOSE 141*  BUN 10  CREATININE 1.02*  CALCIUM 9.2   GFR: Estimated Creatinine Clearance: 67.6 mL/min (A) (by C-G formula based on SCr of 1.02 mg/dL (H)). Liver Function Tests: Recent Labs  Lab 02/14/20 1159  AST 22  ALT 17  ALKPHOS 63  BILITOT 0.5  PROT 7.3  ALBUMIN 3.7   No results for input(s): LIPASE, AMYLASE in the last 168 hours. No results for input(s): AMMONIA in the last 168 hours. Coagulation Profile: No results for input(s): INR, PROTIME in the last 168 hours. Cardiac Enzymes: No results for input(s): CKTOTAL, CKMB, CKMBINDEX, TROPONINI in the last 168 hours. BNP (last 3 results) No results for input(s): PROBNP in the last 8760 hours. HbA1C: No results for input(s): HGBA1C in the last 72 hours. CBG: No results for input(s): GLUCAP in the last 168 hours. Lipid Profile: No results for input(s): CHOL, HDL, LDLCALC, TRIG, CHOLHDL, LDLDIRECT in the last 72 hours. Thyroid Function Tests: No results for input(s): TSH, T4TOTAL, FREET4, T3FREE, THYROIDAB in the last 72 hours. Anemia Panel: No results for input(s): VITAMINB12, FOLATE,  FERRITIN, TIBC, IRON, RETICCTPCT in the last 72 hours. Urine analysis: No results found for: COLORURINE, APPEARANCEUR, LABSPEC, PHURINE, GLUCOSEU, HGBUR, BILIRUBINUR, KETONESUR, PROTEINUR, UROBILINOGEN, NITRITE, LEUKOCYTESUR  Radiological Exams on Admission: No results found.   Assessment/Plan Principal Problem:   Symptomatic anemia Active Problems:   Diabetes mellitus without complication ([facility])   Hypertension   AKI (acute kidney injury) ([place])   Thrombocytosis ([facility])   HLD (hyperlipidemia)   Carotid artery stenosis, unilateral, right   Symptomatic anemia/acute blood loss anemia/GI bleed: -Patient presented with generalized weakness.  H&H 7.9/28.2 was 12.0/36.5-in April 2021.  POC occult blood positive.  Has history of chronic IDA-on iron supplements at home.  - Current vital signs stable.  She is maintaining oxygen saturation on room air.  COVID-19 is pending.   -1 unit PRBC ordered by EDP  -EDP consulted GI-recommended upper GI endoscopy tomorrow a.m. -Admit patient on the floor. -Monitor H&H closely.  Check iron studies.  N.p.o. after midnight.  Protonix IV twice daily. -Monitor vitals closely.  Type 2 diabetes mellitus: Hold  Metformin and Tradjenta and started patient on sliding scale insulin.  Check A1c.  Hypertension: Blood pressure is stable -Continue HCTZ and lisinopril.  Monitor blood pressure closely  Hyperlipidemia: Continue statin  Thrombocytosis: Likely reactive -Repeat CBC tomorrow a.m.  Right carotid artery stenosis status post stent placement in May 2021 -Hold aspirin and Plavix for now.  DVT prophylaxis: SCD Code Status: Full code Family Communication: None present at bedside.  Plan of care discussed with patient in length and she verbalized understanding and agreed with it. Disposition Plan: Home in 1 to 2 days Consults called: GI by EDP Admission status: Inpatient  Mckinley Jewel MD [facility]  If 7PM-7AM, please contact  night-coverage [email or web address] Password [email or web address]  02/14/2020, 3:17 PM

## 2020-02-14 NOTE — ED Notes (Signed)
Pt given gown to change into, prefers to sit in chair in the room

## 2020-02-14 NOTE — ED Notes (Signed)
Report given to ruth rn on 5c

## 2020-02-14 NOTE — ED Triage Notes (Signed)
Pt reports she had recent labs drawn at pcp, was told hgb was 7 and to come to ED. Reports hx of anemia without ever needed transfusion in the past, a/ox4, resp e/u, nad. Takes plavix, denies obvious bleeding but does report dark stools, takes iron daily but stools have been darker recently.

## 2020-02-15 ENCOUNTER — Inpatient Hospital Stay (HOSPITAL_COMMUNITY): Payer: Medicare Other | Admitting: Certified Registered Nurse Anesthetist

## 2020-02-15 ENCOUNTER — Encounter (HOSPITAL_COMMUNITY): Payer: Self-pay | Admitting: Internal Medicine

## 2020-02-15 ENCOUNTER — Encounter (HOSPITAL_COMMUNITY)
Admission: EM | Disposition: A | Discharge status: Home / Self Care | Payer: Self-pay | Source: Home / Self Care | Attending: Family Medicine

## 2020-02-15 DIAGNOSIS — E119 Type 2 diabetes mellitus without complications: Secondary | ICD-10-CM

## 2020-02-15 DIAGNOSIS — I6521 Occlusion and stenosis of right carotid artery: Secondary | ICD-10-CM

## 2020-02-15 DIAGNOSIS — K921 Melena: Secondary | ICD-10-CM

## 2020-02-15 DIAGNOSIS — N179 Acute kidney failure, unspecified: Secondary | ICD-10-CM

## 2020-02-15 HISTORY — PX: ESOPHAGOGASTRODUODENOSCOPY (EGD) WITH PROPOFOL: SHX5813

## 2020-02-15 LAB — COMPREHENSIVE METABOLIC PANEL
ALT: 15 U/L (ref 0–44)
AST: 16 U/L (ref 15–41)
Albumin: 3.2 g/dL — ABNORMAL LOW (ref 3.5–5.0)
Alkaline Phosphatase: 53 U/L (ref 38–126)
Anion gap: 13 (ref 5–15)
BUN: 8 mg/dL (ref 8–23)
CO2: 21 mmol/L — ABNORMAL LOW (ref 22–32)
Calcium: 9.2 mg/dL (ref 8.9–10.3)
Chloride: 107 mmol/L (ref 98–111)
Creatinine, Ser: 0.8 mg/dL (ref 0.44–1.00)
GFR calc Af Amer: >60 mL/min (ref >60)
GFR calc non Af Amer: >60 mL/min (ref >60)
Glucose, Bld: 144 mg/dL — ABNORMAL HIGH (ref 70–99)
Potassium: 3.7 mmol/L (ref 3.5–5.1)
Sodium: 141 mmol/L (ref 135–145)
Total Bilirubin: 1 mg/dL (ref 0.3–1.2)
Total Protein: 6.6 g/dL (ref 6.5–8.1)

## 2020-02-15 LAB — CBC
HCT: 28.6 % — ABNORMAL LOW (ref 36.0–46.0)
Hemoglobin: 8.5 g/dL — ABNORMAL LOW (ref 12.0–15.0)
MCH: 24 pg — ABNORMAL LOW (ref 26.0–34.0)
MCHC: 29.7 g/dL — ABNORMAL LOW (ref 30.0–36.0)
MCV: 80.8 fL (ref 80.0–100.0)
Platelets: 526 10*3/uL — ABNORMAL HIGH (ref 150–400)
RBC: 3.54 MIL/uL — ABNORMAL LOW (ref 3.87–5.11)
RDW: 17.3 % — ABNORMAL HIGH (ref 11.5–15.5)
WBC: 6.8 10*3/uL (ref 4.0–10.5)
nRBC: 0 % (ref 0.0–0.2)

## 2020-02-15 LAB — GLUCOSE, CAPILLARY
Glucose-Capillary: 122 mg/dL — ABNORMAL HIGH (ref 70–99)
Glucose-Capillary: 144 mg/dL — ABNORMAL HIGH (ref 70–99)
Glucose-Capillary: 187 mg/dL — ABNORMAL HIGH (ref 70–99)
Glucose-Capillary: 151 mg/dL — ABNORMAL HIGH (ref 70–99)
Glucose-Capillary: 182 mg/dL — ABNORMAL HIGH (ref 70–99)
Glucose-Capillary: 129 mg/dL — ABNORMAL HIGH (ref 70–99)

## 2020-02-15 SURGERY — ESOPHAGOGASTRODUODENOSCOPY (EGD) WITH PROPOFOL
Anesthesia: Monitor Anesthesia Care

## 2020-02-15 MED ORDER — PROPOFOL 10 MG/ML IV BOLUS
INTRAVENOUS | Status: DC | PRN
  Administered 2020-02-15 (×2): 20 mg via INTRAVENOUS

## 2020-02-15 MED ORDER — PROPOFOL 500 MG/50ML IV EMUL
INTRAVENOUS | Status: DC | PRN
  Administered 2020-02-15: 100 ug/kg/min via INTRAVENOUS

## 2020-02-15 MED ORDER — PANTOPRAZOLE SODIUM 40 MG PO TBEC
40.0000 mg | DELAYED_RELEASE_TABLET | Freq: Every day | ORAL | Status: DC
  Administered 2020-02-16 – 2020-02-18 (×3): 40 mg via ORAL
  Filled 2020-02-15 (×3): qty 1

## 2020-02-15 MED ORDER — LACTATED RINGERS IV SOLN
INTRAVENOUS | Status: AC | PRN
  Administered 2020-02-15: 1000 mL via INTRAVENOUS

## 2020-02-15 MED ORDER — ASPIRIN EC 81 MG PO TBEC
81.0000 mg | DELAYED_RELEASE_TABLET | Freq: Every day | ORAL | Status: DC
  Administered 2020-02-15 – 2020-02-18 (×4): 81 mg via ORAL
  Filled 2020-02-15 (×4): qty 1

## 2020-02-15 SURGICAL SUPPLY — 15 items

## 2020-02-15 NOTE — Progress Notes (Addendum)
PROGRESS NOTE    Natasha Osborn  OZH:[id number] DOB: 1955/12/29 DOA: 02/14/2020 PCP: Patient, No Pcp Per   Brief Narrative: Natasha Osborn is a 64 y.o. female with medical history significant of hypertension, hyperlipidemia, diabetes mellitus, morbid obesity, right carotid artery stenosis s/p stent. She presented secondary to melena and symptomatic anemia with concern for upper GI bleeding.   Assessment & Plan:   Principal Problem:   Symptomatic anemia Active Problems:   Diabetes mellitus without complication (HCC)   Hypertension   AKI (acute kidney injury) ([place])   Thrombocytosis (HCC)   HLD (hyperlipidemia)   Carotid artery stenosis, unilateral, right   Symptomatic anemia Acute blood loss anemia GI bleeding Patient presented with a hemoglobin of 7.9 and received 1 unit of PRBC for symptomatic anemia in setting of melena. EGD performed on 8/7 which was unremarkable for etiology of bleed. -GI recommendations: colonoscopy pending Plavix washout -Serial CBC/H&H  Diabetes mellitus, type 2 Patient is on Tradjenta and metformin -Continue SSI  AKI In setting of likely volume depletion. Resolved with fluids.  Essential hypertension Patient is on amlodipine, hydrochlorothiazide and lisinopril as an outpatient. Held on admission in setting of GI bleed. Blood pressure mostly controlled.  Carotid artery stenosis Patient is on aspirin and Plavix as an outpatient were held on admission. -Continue to hold Plavix -Aspirin resumed  Thrombocytosis Noted. Unsure of etiology. Monitor.   DVT prophylaxis: SCDs Code Status:   Code Status: Full Code Family Communication: Husband at bedside Disposition Plan: Discharge home per GI recommendations for discharge/outpatient follow-up   Consultants:   Gastroenterology  Procedures:   EGD (02/15/2020) Impression:               - Normal UGI tract.                           - These findings do not explain her hemocult                             positive dark stools with IDA. Recommendation:           - Return patient to hospital ward for ongoing care.                           - We will follow along, will plan for colonoscopy                            after full plavix washout (last dose was 8/5, so                            looking at [facility] on Tuesday August 10th). OK                            to resume ASA 81mg  BID as she has been taking (I                            will reorder)                           - Primay team should determine if she needs both  ASA AND plavix going forward.                           - OK to resume heart healthy diet for now.  Antimicrobials:  None    Subjective: Melena this morning that was not as black as usual.  Objective: Vitals:   02/14/20 1811 02/14/20 1850 02/14/20 2359 02/15/20 0506  BP: (!) 122/59 122/63 (!) 128/58 108/61  Pulse: 99 92 93 88  Resp: 16 16 18 18   Temp: 97.8 F (36.6 C) 98.3 F (36.8 C) 98.2 F (36.8 C) 98.1 F (36.7 C)  TempSrc:  Oral Oral Oral  SpO2: 100% 100% 99% 99%  Weight:    117.4 kg  Height:        Intake/Output Summary (Last 24 hours) at 02/15/2020 0960 Last data filed at 02/14/2020 2300 Gross per 24 hour  Intake 590.5 ml  Output --  Net 590.5 ml   Filed Weights   02/14/20 1144 02/15/20 0506  Weight: 117.9 kg 117.4 kg    Examination:  General exam: Appears calm and comfortable Respiratory system: Clear to auscultation. Respiratory effort normal. Cardiovascular system: S1 & S2 heard, RRR. No murmurs, rubs, gallops or clicks. Gastrointestinal system: Abdomen is nondistended, soft and nontender. No organomegaly or masses felt. Normal bowel sounds heard. Central nervous system: Alert and oriented. No focal neurological deficits. Musculoskeletal: No edema. No calf tenderness Skin: No cyanosis. No rashes Psychiatry: Judgement and insight appear normal. Mood & affect appropriate.     Data Reviewed: I  have personally reviewed following labs and imaging studies  CBC Lab Results  Component Value Date   WBC 7.7 02/14/2020   RBC 3.35 (L) 02/14/2020   HGB 7.9 (L) 02/14/2020   HCT 28.2 (L) 02/14/2020   MCV 84.2 02/14/2020   MCH 23.6 (L) 02/14/2020   PLT 549 (H) 02/14/2020   MCHC 28.0 (L) 02/14/2020   RDW 16.4 (H) 45/40/9811     Last metabolic panel Lab Results  Component Value Date   NA 139 02/14/2020   K 3.9 02/14/2020   CL 104 02/14/2020   CO2 20 (L) 02/14/2020   BUN 10 02/14/2020   CREATININE 1.02 (H) 02/14/2020   GLUCOSE 141 (H) 02/14/2020   GFRNONAA 58 (L) 02/14/2020   GFRAA >60 02/14/2020   CALCIUM 9.2 02/14/2020   PROT 7.3 02/14/2020   ALBUMIN 3.7 02/14/2020   BILITOT 0.5 02/14/2020   ALKPHOS 63 02/14/2020   AST 22 02/14/2020   ALT 17 02/14/2020   ANIONGAP 15 02/14/2020    CBG (last 3)  Recent Labs    02/14/20 1651 02/14/20 2117 02/15/20 0557  GLUCAP 105* 111* 122*     GFR: Estimated Creatinine Clearance: 67.4 mL/min (A) (by C-G formula based on SCr of 1.02 mg/dL (H)).  Coagulation Profile: Recent Labs  Lab 02/14/20 1620  INR 1.0    Recent Results (from the past 240 hour(s))  SARS Coronavirus 2 by RT PCR (hospital order, performed in [facility] hospital lab) Nasopharyngeal Nasopharyngeal Swab     Status: None   Collection Time: 02/14/20  2:54 PM   Specimen: Nasopharyngeal Swab  Result Value Ref Range Status   SARS Coronavirus 2 NEGATIVE NEGATIVE Final    Comment: (NOTE) SARS-CoV-2 target nucleic acids are NOT DETECTED.  The SARS-CoV-2 RNA is generally detectable in upper and lower respiratory specimens during the acute phase of infection. The lowest concentration of SARS-CoV-2 viral copies this assay can detect is  250 copies / mL. A negative result does not preclude SARS-CoV-2 infection and should not be used as the sole basis for treatment or other patient management decisions.  A negative result may occur with improper specimen  collection / handling, submission of specimen other than nasopharyngeal swab, presence of viral mutation(s) within the areas targeted by this assay, and inadequate number of viral copies (<250 copies / mL). A negative result must be combined with clinical observations, patient history, and epidemiological information.  Fact Sheet for Patients:   StrictlyIdeas.no  Fact Sheet for Healthcare Providers: BankingDealers.co.za  This test is not yet approved or  cleared by the [place] FDA and has been authorized for detection and/or diagnosis of SARS-CoV-2 by FDA under an Emergency Use Authorization (EUA).  This EUA will remain in effect (meaning this test can be used) for the duration of the COVID-19 declaration under Section 564(b)(1) of the Act, 21 U.S.C. section 360bbb-3(b)(1), unless the authorization is terminated or revoked sooner.  Performed at [facility], [place] [address] [place], [place] [address]         Radiology Studies: No results found.      Scheduled Meds: . insulin aspart  0-15 Units Subcutaneous TID WC  . insulin aspart  0-5 Units Subcutaneous QHS  . pantoprazole (PROTONIX) IV  40 mg Intravenous Q12H   Continuous Infusions: . sodium chloride       LOS: 1 day     Cordelia Poche, MD [facility] 02/15/2020, 7:22 AM  If 7PM-7AM, please contact night-coverage [email or web address]

## 2020-02-15 NOTE — Interval H&P Note (Signed)
History and Physical Interval Note:  02/15/2020 12:16 PM  Natasha Osborn  has presented today for surgery, with the diagnosis of gastrointestinal bleeding with melena.  The various methods of treatment have been discussed with the patient and family. After consideration of risks, benefits and other options for treatment, the patient has consented to  Procedure(s): ESOPHAGOGASTRODUODENOSCOPY (EGD) WITH PROPOFOL (N/A) as a surgical intervention.  The patient's history has been reviewed, patient examined, no change in status, stable for surgery.  I have reviewed the patient's chart and labs.  Questions were answered to the patient's satisfaction.     Milus Banister

## 2020-02-15 NOTE — Anesthesia Postprocedure Evaluation (Signed)
Anesthesia Post Note  Patient: Natasha Osborn  Procedure(s) Performed: ESOPHAGOGASTRODUODENOSCOPY (EGD) WITH PROPOFOL (N/A )     Patient location during evaluation: PACU Anesthesia Type: MAC Level of consciousness: awake and alert Pain management: pain level controlled Vital Signs Assessment: post-procedure vital signs reviewed and stable Respiratory status: spontaneous breathing, nonlabored ventilation, respiratory function stable and patient connected to nasal cannula oxygen Cardiovascular status: stable and blood pressure returned to baseline Postop Assessment: no apparent nausea or vomiting Anesthetic complications: no   No complications documented.  Last Vitals:  Vitals:   02/15/20 1309 02/15/20 1313  BP: (!) 114/55 (!) 121/58  Pulse: 93 91  Resp: 17 17  Temp:    SpO2: 100% 100%    Last Pain:  Vitals:   02/15/20 1313  TempSrc:   PainSc: 0-No pain                 [name] DAVID

## 2020-02-15 NOTE — Op Note (Addendum)
[facility] Patient Name: Natasha Osborn Procedure Date : 02/15/2020 MRN: [id number] Attending MD: Milus Banister , MD Date of Birth: April 24, 1956 CSN: [id number] Age: 64 Admit Type: Inpatient Procedure:                Upper GI endoscopy Indications:              Iron deficiency anemia, hemocult + dark stools                            since starting plavix (in addition to BID baby ASA)                            3 months ago after carotid stenting Providers:                Milus Banister, MD, Janee Morn, Technician,                            Laverda Sorenson, Technician, Glori Bickers, RN Referring MD:              Medicines:                Monitored Anesthesia Care Complications:            No immediate complications. Estimated blood loss:                            None. Estimated Blood Loss:     Estimated blood loss: none. Procedure:                Pre-Anesthesia Assessment:                           - Prior to the procedure, a History and Physical                            was performed, and patient medications and                            allergies were reviewed. The patient's tolerance of                            previous anesthesia was also reviewed. The risks                            and benefits of the procedure and the sedation                            options and risks were discussed with the patient.                            All questions were answered, and informed consent                            was obtained. Prior Anticoagulants: The patient has  taken Plavix (clopidogrel), last dose was 2 days                            prior to procedure. ASA Grade Assessment: III - A                            patient with severe systemic disease. After                            reviewing the risks and benefits, the patient was                            deemed in satisfactory condition to undergo the                             procedure.                           After obtaining informed consent, the endoscope was                            passed under direct vision. Throughout the                            procedure, the patient's blood pressure, pulse, and                            oxygen saturations were monitored continuously. The                            GIF-H190 ([id number]) Olympus gastroscope was                            introduced through the mouth, and advanced to the                            second part of duodenum. The upper GI endoscopy was                            accomplished without difficulty. The patient                            tolerated the procedure well. Scope In: Scope Out: Findings:      The esophagus was normal.      The stomach was normal.      The examined duodenum was normal. Impression:               - Normal UGI tract.                           - These findings do not explain her hemocult                            positive dark stools with IDA. Recommendation:           -  Return patient to hospital ward for ongoing care.                           - We will follow along, will plan for colonoscopy                            after full plavix washout (last dose was 8/5, so                            looking at [facility] on Tuesday August 10th). OK                            to resume ASA 81mg  BID as she has been taking (I                            will reorder)                           - Primay team should determine if she needs both                            ASA AND plavix going forward.                           - OK to resume heart healthy diet for now. Procedure Code(s):        --- Professional ---                           [phone], Esophagogastroduodenoscopy, flexible,                            transoral; diagnostic, including collection of                            specimen(s) by brushing or washing, when performed                            (separate  procedure) Diagnosis Code(s):        --- Professional ---                           D50.9, Iron deficiency anemia, unspecified                           K92.1, Melena (includes Hematochezia) CPT copyright 2019 American Medical Association. All rights reserved. The codes documented in this report are preliminary and upon coder review may  be revised to meet current compliance requirements. Milus Banister, MD 02/15/2020 12:59:34 PM This report has been signed electronically. Number of Addenda: 0

## 2020-02-15 NOTE — Anesthesia Preprocedure Evaluation (Signed)
Anesthesia Evaluation  Patient identified by MRN, date of birth, ID band Patient awake    Reviewed: Allergy & Precautions, NPO status , Patient's Chart, lab work & pertinent test results  Airway Mallampati: I  TM Distance: >3 FB Neck ROM: Full    Dental   Pulmonary    Pulmonary exam normal        Cardiovascular hypertension, Pt. on medications + Peripheral Vascular Disease  Normal cardiovascular exam     Neuro/Psych    GI/Hepatic   Endo/Other  diabetes, Type 2, Oral Hypoglycemic Agents  Renal/GU      Musculoskeletal   Abdominal   Peds  Hematology   Anesthesia Other Findings   Reproductive/Obstetrics                             Anesthesia Physical Anesthesia Plan  ASA: III  Anesthesia Plan: MAC   Post-op Pain Management:    Induction: Intravenous  PONV Risk Score and Plan: 2 and Treatment may vary due to age or medical condition  Airway Management Planned: Nasal Cannula  Additional Equipment:   Intra-op Plan:   Post-operative Plan:   Informed Consent: I have reviewed the patients History and Physical, chart, labs and discussed the procedure including the risks, benefits and alternatives for the proposed anesthesia with the patient or authorized representative who has indicated his/her understanding and acceptance.       Plan Discussed with: Surgeon and CRNA  Anesthesia Plan Comments:         Anesthesia Quick Evaluation

## 2020-02-15 NOTE — Transfer of Care (Signed)
Immediate Anesthesia Transfer of Care Note  Patient: Natasha Osborn  Procedure(s) Performed: ESOPHAGOGASTRODUODENOSCOPY (EGD) WITH PROPOFOL (N/A )  Patient Location: PACU  Anesthesia Type:MAC  Level of Consciousness: awake, alert  and oriented  Airway & Oxygen Therapy: Patient Spontanous Breathing  Post-op Assessment: Report given to RN and Post -op Vital signs reviewed and stable  Post vital signs: Reviewed and stable  Last Vitals:  Vitals Value Taken Time  BP    Temp    Pulse    Resp    SpO2      Last Pain:  Vitals:   02/15/20 1158  TempSrc: Oral  PainSc: 0-No pain         Complications: No complications documented.

## 2020-02-16 LAB — GLUCOSE, CAPILLARY
Glucose-Capillary: 141 mg/dL — ABNORMAL HIGH (ref 70–99)
Glucose-Capillary: 164 mg/dL — ABNORMAL HIGH (ref 70–99)
Glucose-Capillary: 137 mg/dL — ABNORMAL HIGH (ref 70–99)
Glucose-Capillary: 158 mg/dL — ABNORMAL HIGH (ref 70–99)

## 2020-02-16 LAB — CBC
HCT: 27.7 % — ABNORMAL LOW (ref 36.0–46.0)
Hemoglobin: 7.9 g/dL — ABNORMAL LOW (ref 12.0–15.0)
MCH: 23.2 pg — ABNORMAL LOW (ref 26.0–34.0)
MCHC: 28.5 g/dL — ABNORMAL LOW (ref 30.0–36.0)
MCV: 81.2 fL (ref 80.0–100.0)
Platelets: 499 10*3/uL — ABNORMAL HIGH (ref 150–400)
RBC: 3.41 MIL/uL — ABNORMAL LOW (ref 3.87–5.11)
RDW: 16.7 % — ABNORMAL HIGH (ref 11.5–15.5)
WBC: 8 10*3/uL (ref 4.0–10.5)
nRBC: 0 % (ref 0.0–0.2)

## 2020-02-16 LAB — HEMOGLOBIN AND HEMATOCRIT, BLOOD
HCT: 26.2 % — ABNORMAL LOW (ref 36.0–46.0)
HCT: 26.3 % — ABNORMAL LOW (ref 36.0–46.0)
Hemoglobin: 7.7 g/dL — ABNORMAL LOW (ref 12.0–15.0)
Hemoglobin: 7.6 g/dL — ABNORMAL LOW (ref 12.0–15.0)

## 2020-02-16 NOTE — Plan of Care (Signed)

## 2020-02-16 NOTE — Progress Notes (Signed)
PROGRESS NOTE    Natasha Osborn  OEH:[id number] DOB: 02/25/1956 DOA: 02/14/2020 PCP: Patient, No Pcp Per   Brief Narrative: Natasha Osborn is a 64 y.o. female with medical history significant of hypertension, hyperlipidemia, diabetes mellitus, morbid obesity, right carotid artery stenosis s/p stent. She presented secondary to melena and symptomatic anemia with concern for upper GI bleeding.   Assessment & Plan:   Principal Problem:   Symptomatic anemia Active Problems:   Diabetes mellitus without complication (HCC)   Hypertension   AKI (acute kidney injury) ([place])   Thrombocytosis (HCC)   HLD (hyperlipidemia)   Carotid artery stenosis, unilateral, right   Gastrointestinal hemorrhage with melena   Symptomatic anemia Acute blood loss anemia GI bleeding Patient presented with a hemoglobin of 7.9 and received 1 unit of PRBC for symptomatic anemia in setting of melena. EGD performed on 8/7 which was unremarkable for etiology of bleed. Hemoglobin trended down and patient has continued melena. -GI recommendations: colonoscopy pending Plavix washout -Serial CBC/H&H  Diabetes mellitus, type 2 Patient is on Tradjenta and metformin -Continue SSI  AKI In setting of likely volume depletion. Resolved with fluids.  Essential hypertension Patient is on amlodipine, hydrochlorothiazide and lisinopril as an outpatient. Held on admission in setting of GI bleed. Blood pressure mostly controlled.  Carotid artery stenosis Patient is on aspirin and Plavix as an outpatient were held on admission. -Continue to hold Plavix -Aspirin resumed  Thrombocytosis Noted. Unsure of etiology. Monitor.   DVT prophylaxis: SCDs Code Status:   Code Status: Full Code Family Communication: Husband at bedside Disposition Plan: Discharge home per GI recommendations for discharge/outpatient follow-up   Consultants:   Gastroenterology  Procedures:   EGD (02/15/2020) Impression:               -  Normal UGI tract.                           - These findings do not explain her hemocult                            positive dark stools with IDA. Recommendation:           - Return patient to hospital ward for ongoing care.                           - We will follow along, will plan for colonoscopy                            after full plavix washout (last dose was 8/5, so                            looking at [facility] on Tuesday August 10th). OK                            to resume ASA 81mg  BID as she has been taking (I                            will reorder)                           - Primay team should  determine if she needs both                            ASA AND plavix going forward.                           - OK to resume heart healthy diet for now.  Antimicrobials:  None    Subjective: Continued melanotic stools  Objective: Vitals:   02/15/20 1811 02/16/20 0102 02/16/20 0642 02/16/20 1305  BP: (!) 155/73 119/62 139/81 (!) 158/69  Pulse: 91 87 83 89  Resp: 16 16 16 16   Temp: 98.6 F (37 C) 98 F (36.7 C) 98.1 F (36.7 C) 98.2 F (36.8 C)  TempSrc: Oral Oral Oral Oral  SpO2: 100% 100% 100% 100%  Weight:      Height:        Intake/Output Summary (Last 24 hours) at 02/16/2020 1331 Last data filed at 02/16/2020 1000 Gross per 24 hour  Intake 720 ml  Output --  Net 720 ml   Filed Weights   02/14/20 1144 02/15/20 0506 02/15/20 1158  Weight: 117.9 kg 117.4 kg 117.4 kg    Examination:  General exam: Appears calm and comfortable Respiratory system: Clear to auscultation. Respiratory effort normal. Cardiovascular system: S1 & S2 heard, RRR. No murmurs, rubs, gallops or clicks. Gastrointestinal system: Abdomen is nondistended, soft and nontender. No organomegaly or masses felt. Normal bowel sounds heard. Central nervous system: Alert and oriented. No focal neurological deficits. Musculoskeletal: 1+ pitting edema. No calf tenderness Skin: No cyanosis. No  rashes Psychiatry: Judgement and insight appear normal. Mood & affect appropriate.     Data Reviewed: I have personally reviewed following labs and imaging studies  CBC Lab Results  Component Value Date   WBC 8.0 02/16/2020   RBC 3.41 (L) 02/16/2020   HGB 7.9 (L) 02/16/2020   HCT 27.7 (L) 02/16/2020   MCV 81.2 02/16/2020   MCH 23.2 (L) 02/16/2020   PLT 499 (H) 02/16/2020   MCHC 28.5 (L) 02/16/2020   RDW 16.7 (H) 09/32/3557     Last metabolic panel Lab Results  Component Value Date   NA 141 02/15/2020   K 3.7 02/15/2020   CL 107 02/15/2020   CO2 21 (L) 02/15/2020   BUN 8 02/15/2020   CREATININE 0.80 02/15/2020   GLUCOSE 144 (H) 02/15/2020   GFRNONAA >60 02/15/2020   GFRAA >60 02/15/2020   CALCIUM 9.2 02/15/2020   PROT 6.6 02/15/2020   ALBUMIN 3.2 (L) 02/15/2020   BILITOT 1.0 02/15/2020   ALKPHOS 53 02/15/2020   AST 16 02/15/2020   ALT 15 02/15/2020   ANIONGAP 13 02/15/2020    CBG (last 3)  Recent Labs    02/15/20 2107 02/16/20 0644 02/16/20 1110  GLUCAP 129* 141* 164*     GFR: Estimated Creatinine Clearance: 85.9 mL/min (by C-G formula based on SCr of 0.8 mg/dL).  Coagulation Profile: Recent Labs  Lab 02/14/20 1620  INR 1.0    Recent Results (from the past 240 hour(s))  SARS Coronavirus 2 by RT PCR (hospital order, performed in [facility] hospital lab) Nasopharyngeal Nasopharyngeal Swab     Status: None   Collection Time: 02/14/20  2:54 PM   Specimen: Nasopharyngeal Swab  Result Value Ref Range Status   SARS Coronavirus 2 NEGATIVE NEGATIVE Final    Comment: (NOTE) SARS-CoV-2 target nucleic acids are NOT DETECTED.  The SARS-CoV-2 RNA is  generally detectable in upper and lower respiratory specimens during the acute phase of infection. The lowest concentration of SARS-CoV-2 viral copies this assay can detect is 250 copies / mL. A negative result does not preclude SARS-CoV-2 infection and should not be used as the sole basis for treatment or  other patient management decisions.  A negative result may occur with improper specimen collection / handling, submission of specimen other than nasopharyngeal swab, presence of viral mutation(s) within the areas targeted by this assay, and inadequate number of viral copies (<250 copies / mL). A negative result must be combined with clinical observations, patient history, and epidemiological information.  Fact Sheet for Patients:   StrictlyIdeas.no  Fact Sheet for Healthcare Providers: BankingDealers.co.za  This test is not yet approved or  cleared by the [place] FDA and has been authorized for detection and/or diagnosis of SARS-CoV-2 by FDA under an Emergency Use Authorization (EUA).  This EUA will remain in effect (meaning this test can be used) for the duration of the COVID-19 declaration under Section 564(b)(1) of the Act, 21 U.S.C. section 360bbb-3(b)(1), unless the authorization is terminated or revoked sooner.  Performed at [facility], [place] [address], [place], [place] [address]         Radiology Studies: No results found.      Scheduled Meds: . aspirin EC  81 mg Oral Daily  . insulin aspart  0-15 Units Subcutaneous TID WC  . insulin aspart  0-5 Units Subcutaneous QHS  . pantoprazole  40 mg Oral Q0600   Continuous Infusions: . sodium chloride       LOS: 2 days     Cordelia Poche, MD [facility] 02/16/2020, 1:31 PM  If 7PM-7AM, please contact night-coverage [email or web address]

## 2020-02-17 DIAGNOSIS — D649 Anemia, unspecified: Secondary | ICD-10-CM

## 2020-02-17 LAB — CBC
HCT: 25 % — ABNORMAL LOW (ref 36.0–46.0)
Hemoglobin: 7.3 g/dL — ABNORMAL LOW (ref 12.0–15.0)
MCH: 23.7 pg — ABNORMAL LOW (ref 26.0–34.0)
MCHC: 29.2 g/dL — ABNORMAL LOW (ref 30.0–36.0)
MCV: 81.2 fL (ref 80.0–100.0)
Platelets: 494 10*3/uL — ABNORMAL HIGH (ref 150–400)
RBC: 3.08 MIL/uL — ABNORMAL LOW (ref 3.87–5.11)
RDW: 16.6 % — ABNORMAL HIGH (ref 11.5–15.5)
WBC: 8.7 10*3/uL (ref 4.0–10.5)
nRBC: 0 % (ref 0.0–0.2)

## 2020-02-17 LAB — HEMOGLOBIN AND HEMATOCRIT, BLOOD
HCT: 32.3 % — ABNORMAL LOW (ref 36.0–46.0)
Hemoglobin: 9.7 g/dL — ABNORMAL LOW (ref 12.0–15.0)

## 2020-02-17 LAB — GLUCOSE, CAPILLARY
Glucose-Capillary: 141 mg/dL — ABNORMAL HIGH (ref 70–99)
Glucose-Capillary: 170 mg/dL — ABNORMAL HIGH (ref 70–99)
Glucose-Capillary: 144 mg/dL — ABNORMAL HIGH (ref 70–99)
Glucose-Capillary: 112 mg/dL — ABNORMAL HIGH (ref 70–99)

## 2020-02-17 LAB — PREPARE RBC (CROSSMATCH)

## 2020-02-17 MED ORDER — SODIUM CHLORIDE 0.9% IV SOLUTION: Freq: Once | INTRAVENOUS | Status: AC

## 2020-02-17 MED ORDER — PEG-KCL-NACL-NASULF-NA ASC-C 100 G PO SOLR
0.5000 | Freq: Once | ORAL | Status: AC
  Administered 2020-02-18: 100 g via ORAL

## 2020-02-17 MED ORDER — PEG-KCL-NACL-NASULF-NA ASC-C 100 G PO SOLR
0.5000 | Freq: Once | ORAL | Status: AC
  Administered 2020-02-17: 100 g via ORAL
  Filled 2020-02-17: qty 1

## 2020-02-17 MED ORDER — PEG-KCL-NACL-NASULF-NA ASC-C 100 G PO SOLR: 1.0000 | Freq: Once | ORAL | Status: DC

## 2020-02-17 NOTE — H&P (View-Only) (Signed)
Progress Note  CC:   GI bleed       ASSESSMENT AND PLAN:   Natasha Osborn is a 64 y.o. female PMH significant for, but not necessarily limited to,   diabetes , hyperlipidemia, obesity,  CAD,  carotid stenosis s/p right stent in May                                                                                                                            # Acute on chronic iron deficiency anemia, now with heme + dark stools ( ion iron) , on Plavix and ASA --Chronic IDA of unknown etiology ( ? No prior workup). On chronic iron.  --Sent to ED by PCP with ~ 5 gram decline in hgb since April and dark stools since May when carotid stent was placed and she started plavix.  --Hgb drifting since 1 uPRBC on day of admission ( 8.5 >> 7.7 >>> 7.3). Will order a unit of blood for today --Unable to retrieve Saturday's EGD report in Epic right now but no findings to explain GI bleed.   Plan is for colonoscopy tomorrow ( 5th day off Plavix).   # Dual anti-platelet therapy with plavix / asa for carotid artery stent  -Hold Plavix        SUBJECTIVE   No complaints. No bleeding today    OBJECTIVE:     Vital signs in last 24 hours: Temp:  [97.4 F (36.3 C)-98.2 F (36.8 C)] 97.4 F (36.3 C) (08/09 0536) Pulse Rate:  [86-90] 86 (08/09 0536) Resp:  [16] 16 (08/09 0024) BP: (125-158)/(54-69) 126/54 (08/09 0536) SpO2:  [99 %-100 %] 99 % (08/09 0536) Last BM Date: 02/16/20 General:   Alert, in NAD in bedside chair Heart:  Regular rate and rhythm.  No lower extremity edema   Pulm: Normal respiratory effort   Abdomen:  Soft,  nontender, nondistended.  Normal bowel sounds.          Neurologic:  Alert and  oriented,  grossly normal neurologically. Psych:  Pleasant, cooperative.  Normal mood and affect.   Intake/Output from previous day: 08/08 0701 - 08/09 0700 In: 240 [P.O.:240] Out: -  Intake/Output this shift: No intake/output data recorded.  Lab Results: Recent Labs     02/15/20 0747 02/15/20 0747 02/16/20 0913 02/16/20 0913 02/16/20 1534 02/16/20 2054 02/17/20 0101  WBC 6.8  --  8.0  --   --   --  8.7  HGB 8.5*   < > 7.9*   < > 7.7* 7.6* 7.3*  HCT 28.6*   < > 27.7*   < > 26.2* 26.3* 25.0*  PLT 526*  --  499*  --   --   --  494*   < > = values in this interval not displayed.   BMET Recent Labs    02/14/20 1159 02/15/20 0747  NA 139 141  K 3.9 3.7  CL 104 107  CO2 20*  21*  GLUCOSE 141* 144*  BUN 10 8  CREATININE 1.02* 0.80  CALCIUM 9.2 9.2   LFT Recent Labs    02/15/20 0747  PROT 6.6  ALBUMIN 3.2*  AST 16  ALT 15  ALKPHOS 53  BILITOT 1.0   PT/INR Recent Labs    02/14/20 1620  LABPROT 13.2  INR 1.0     Principal Problem:   Symptomatic anemia Active Problems:   Diabetes mellitus without complication (HCC)   Hypertension   AKI (acute kidney injury) ([place])   Thrombocytosis (HCC)   HLD (hyperlipidemia)   Carotid artery stenosis, unilateral, right   Gastrointestinal hemorrhage with melena     LOS: 3 days   Tye Savoy ,NP 02/17/2020, 10:42 AM    Attending physician's note   I have taken an interval history, reviewed the chart and examined the patient. I agree with the Advanced Practitioner's note, impression and recommendations.   Pt well know to me. Acute on chronic IDA with melena. Neg EGD. Hb 7.3 Carotid artery stent on dual anti-platelet therapy (aspirin/Plavix)  Plan: -Transfuse 1 more unit of PRBC today. -Colon in a.m. (after Plavix washout for 5 days) -If neg, VCE -Trend CBC -D/W pt.   Carmell Austria, MD [facility]

## 2020-02-17 NOTE — Plan of Care (Signed)
Pt progressing and looking forward to discharge in a day or so

## 2020-02-17 NOTE — Progress Notes (Addendum)
Progress Note  CC:   GI bleed       ASSESSMENT AND PLAN:   Natasha Osborn is a 64 y.o. female PMH significant for, but not necessarily limited to,   diabetes , hyperlipidemia, obesity,  CAD,  carotid stenosis s/p right stent in May                                                                                                                            # Acute on chronic iron deficiency anemia, now with heme + dark stools ( ion iron) , on Plavix and ASA --Chronic IDA of unknown etiology ( ? No prior workup). On chronic iron.  --Sent to ED by PCP with ~ 5 gram decline in hgb since April and dark stools since May when carotid stent was placed and she started plavix.  --Hgb drifting since 1 uPRBC on day of admission ( 8.5 >> 7.7 >>> 7.3). Will order a unit of blood for today --Unable to retrieve Saturday's EGD report in Epic right now but no findings to explain GI bleed.   Plan is for colonoscopy tomorrow ( 5th day off Plavix).   # Dual anti-platelet therapy with plavix / asa for carotid artery stent  -Hold Plavix        SUBJECTIVE   No complaints. No bleeding today    OBJECTIVE:     Vital signs in last 24 hours: Temp:  [97.4 F (36.3 C)-98.2 F (36.8 C)] 97.4 F (36.3 C) (08/09 0536) Pulse Rate:  [86-90] 86 (08/09 0536) Resp:  [16] 16 (08/09 0024) BP: (125-158)/(54-69) 126/54 (08/09 0536) SpO2:  [99 %-100 %] 99 % (08/09 0536) Last BM Date: 02/16/20 General:   Alert, in NAD in bedside chair Heart:  Regular rate and rhythm.  No lower extremity edema   Pulm: Normal respiratory effort   Abdomen:  Soft,  nontender, nondistended.  Normal bowel sounds.          Neurologic:  Alert and  oriented,  grossly normal neurologically. Psych:  Pleasant, cooperative.  Normal mood and affect.   Intake/Output from previous day: 08/08 0701 - 08/09 0700 In: 240 [P.O.:240] Out: -  Intake/Output this shift: No intake/output data recorded.  Lab Results: Recent Labs     02/15/20 0747 02/15/20 0747 02/16/20 0913 02/16/20 0913 02/16/20 1534 02/16/20 2054 02/17/20 0101  WBC 6.8  --  8.0  --   --   --  8.7  HGB 8.5*   < > 7.9*   < > 7.7* 7.6* 7.3*  HCT 28.6*   < > 27.7*   < > 26.2* 26.3* 25.0*  PLT 526*  --  499*  --   --   --  494*   < > = values in this interval not displayed.   BMET Recent Labs    02/14/20 1159 02/15/20 0747  NA 139 141  K 3.9 3.7  CL 104 107  CO2 20*  21*  GLUCOSE 141* 144*  BUN 10 8  CREATININE 1.02* 0.80  CALCIUM 9.2 9.2   LFT Recent Labs    02/15/20 0747  PROT 6.6  ALBUMIN 3.2*  AST 16  ALT 15  ALKPHOS 53  BILITOT 1.0   PT/INR Recent Labs    02/14/20 1620  LABPROT 13.2  INR 1.0     Principal Problem:   Symptomatic anemia Active Problems:   Diabetes mellitus without complication (HCC)   Hypertension   AKI (acute kidney injury) ([place])   Thrombocytosis (HCC)   HLD (hyperlipidemia)   Carotid artery stenosis, unilateral, right   Gastrointestinal hemorrhage with melena     LOS: 3 days   Tye Savoy ,NP 02/17/2020, 10:42 AM    Attending physician's note   I have taken an interval history, reviewed the chart and examined the patient. I agree with the Advanced Practitioner's note, impression and recommendations.   Pt well know to me. Acute on chronic IDA with melena. Neg EGD. Hb 7.3 Carotid artery stent on dual anti-platelet therapy (aspirin/Plavix)  Plan: -Transfuse 1 more unit of PRBC today. -Colon in a.m. (after Plavix washout for 5 days) -If neg, VCE -Trend CBC -D/W pt.   Carmell Austria, MD [facility]

## 2020-02-17 NOTE — TOC Initial Note (Signed)
Transition of Care [facility]) - Initial/Assessment Note    Patient Details  Name: Natasha Osborn MRN: [id number] Date of Birth: [birth year]  Transition [facility]) CM/SW Contact:    Pollie Friar, RN Phone Number: 02/17/2020, 2:27 PM  Clinical Narrative:                 Pt lives home with spouse who she says can provide supervision. No DME at home and no issues with transportation or home medications. PCP: Blanch Media NP with Dr [facility] Gra[facility] following for d/c needs.    Expected Discharge Plan: Home/Self Care Barriers to Discharge: Continued Medical Work up   Patient Goals and CMS Choice        Expected Discharge Plan and Services Expected Discharge Plan: Home/Self Care       Living arrangements for the past 2 months: Single Family Home                                      Prior Living Arrangements/Services Living arrangements for the past 2 months: Single Family Home Lives with:: Spouse Patient language and need for interpreter reviewed:: Yes Do you feel safe going back to the place where you live?: Yes      Need for Family Participation in Patient Care: No (Comment) Care giver support system in place?: Yes (comment)   Criminal Activity/Legal Involvement Pertinent to Current Situation/Hospitalization: No - Comment as needed  Activities of Daily Living Home Assistive Devices/Equipment: None ADL Screening (condition at time of admission) Patient's cognitive ability adequate to safely complete daily activities?: Yes Is the patient deaf or have difficulty hearing?: No Does the patient have difficulty seeing, even when wearing glasses/contacts?: No Does the patient have difficulty concentrating, remembering, or making decisions?: No Patient able to express need for assistance with ADLs?: Yes Does the patient have difficulty dressing or bathing?: No Independently performs ADLs?: Yes (appropriate for developmental age) Does the patient have difficulty walking or  climbing stairs?: No Weakness of Legs: Both Weakness of Arms/Hands: None  Permission Sought/Granted                  Emotional Assessment Appearance:: Appears stated age Attitude/Demeanor/Rapport: Engaged Affect (typically observed): Accepting Orientation: : Oriented to Self, Oriented to Place, Oriented to  Time, Oriented to Situation   Psych Involvement: No (comment)  Admission diagnosis:  Gastrointestinal hemorrhage with melena [K92.1] Symptomatic anemia [D64.9] Patient Active Problem List   Diagnosis Date Noted  . Gastrointestinal hemorrhage with melena   . Diabetes mellitus without complication ([place])   . Hypertension   . Symptomatic anemia   . AKI (acute kidney injury) ([place])   . Thrombocytosis ([place])   . HLD (hyperlipidemia)   . Carotid artery stenosis, unilateral, right    PCP:  Patient, No Pcp Per Pharmacy:   [place] [address] [place] [address] Phone: [phone] Fax: [place] [address] [place] [address] [place] [address] Phone: [phone] Fax: [phone]     Social Determinants of Health (SDOH) Interventions    Readmission Risk Interventions No flowsheet data found.

## 2020-02-17 NOTE — Progress Notes (Signed)
PROGRESS NOTE    Natasha Osborn  XTA:[id number] DOB: 1956/04/07 DOA: 02/14/2020 PCP: Patient, No Pcp Per   Brief Narrative: Natasha Osborn is a 64 y.o. female with medical history significant of hypertension, hyperlipidemia, diabetes mellitus, morbid obesity, right carotid artery stenosis s/p stent. She presented secondary to melena and symptomatic anemia with concern for upper GI bleeding.   Assessment & Plan:   Principal Problem:   Symptomatic anemia Active Problems:   Diabetes mellitus without complication (HCC)   Hypertension   AKI (acute kidney injury) ([place])   Thrombocytosis (HCC)   HLD (hyperlipidemia)   Carotid artery stenosis, unilateral, right   Gastrointestinal hemorrhage with melena   Symptomatic anemia Acute blood loss anemia GI bleeding Patient presented with a hemoglobin of 7.9 and received 1 unit of PRBC for symptomatic anemia in setting of melena. EGD performed on 8/7 which was unremarkable for etiology of bleed. Hemoglobin trended down and patient has continued melena. -GI recommendations: colonoscopy pending Plavix washout; 1 unit of PRBC today -Serial CBC/H&H  Diabetes mellitus, type 2 Patient is on Tradjenta and metformin -Continue SSI  AKI In setting of likely volume depletion. Resolved with fluids.  Essential hypertension Patient is on amlodipine, hydrochlorothiazide and lisinopril as an outpatient. Held on admission in setting of GI bleed. Blood pressure mostly controlled.  Carotid artery stenosis Patient is on aspirin and Plavix as an outpatient were held on admission. -Continue to hold Plavix -Aspirin  Thrombocytosis Noted. Unsure of etiology. Monitor.   DVT prophylaxis: SCDs Code Status:   Code Status: Full Code Family Communication: None at bedside Disposition Plan: Discharge home per GI recommendations for discharge/outpatient follow-up   Consultants:   Gastroenterology  Procedures:   EGD (02/15/2020) Impression:                - Normal UGI tract.                           - These findings do not explain her hemocult                            positive dark stools with IDA. Recommendation:           - Return patient to hospital ward for ongoing care.                           - We will follow along, will plan for colonoscopy                            after full plavix washout (last dose was 8/5, so                            looking at [facility] on Tuesday August 10th). OK                            to resume ASA 49m BID as she has been taking (I                            will reorder)                           -  [facility] team should determine if she needs both                            ASA AND plavix going forward.                           - OK to resume heart healthy diet for now.  Antimicrobials:  None    Subjective: Has not had another stool since last assessment. Feels like she will have one soon.  Objective: Vitals:   02/17/20 0024 02/17/20 0536 02/17/20 1151 02/17/20 1222  BP: 125/65 (!) 126/54 133/76 (!) 145/75  Pulse: 90 86 91 87  Resp: _0 Temp: 98 F (36.7 C) (!) 97.4 F (36.3 C) 97.9 F (36.6 C) 97.8 F (36.6 C)  TempSrc: Oral Oral Oral   SpO2: 100% 99% 100% 100%  Weight:      Height:        Intake/Output Summary (Last 24 hours) at 02/17/2020 1328 Last data filed at 02/17/2020 1151 Gross per 24 hour  Intake 12 ml  Output --  Net 12 ml   Filed Weights   02/14/20 1144 02/15/20 0506 02/15/20 1158  Weight: 117.9 kg 117.4 kg 117.4 kg    Examination:  General exam: Appears calm and comfortable Respiratory system: Clear to auscultation. Respiratory effort normal. Cardiovascular system: S1 & S2 heard, RRR. No murmurs, rubs, gallops or clicks. Gastrointestinal system: Abdomen is nondistended, soft and nontender. No organomegaly or masses felt. Normal bowel sounds heard. Central nervous system: Alert and oriented. No focal neurological deficits. Musculoskeletal: LE edema.  No calf tenderness Skin: No cyanosis. No rashes Psychiatry: Judgement and insight appear normal. Mood & affect appropriate.      Data Reviewed: I have personally reviewed following labs and imaging studies  CBC Lab Results  Component Value Date   WBC 8.7 02/17/2020   RBC 3.08 (L) 02/17/2020   HGB 7.3 (L) 02/17/2020   HCT 25.0 (L) 02/17/2020   MCV 81.2 02/17/2020   MCH 23.7 (L) 02/17/2020   PLT 494 (H) 02/17/2020   MCHC 29.2 (L) 02/17/2020   RDW 16.6 (H) 90/93/1121     Last metabolic panel Lab Results  Component Value Date   NA 141 02/15/2020   K 3.7 02/15/2020   CL 107 02/15/2020   CO2 21 (L) 02/15/2020   BUN 8 02/15/2020   CREATININE 0.80 02/15/2020   GLUCOSE 144 (H) 02/15/2020   GFRNONAA >60 02/15/2020   GFRAA >60 02/15/2020   CALCIUM 9.2 02/15/2020   PROT 6.6 02/15/2020   ALBUMIN 3.2 (L) 02/15/2020   BILITOT 1.0 02/15/2020   ALKPHOS 53 02/15/2020   AST 16 02/15/2020   ALT 15 02/15/2020   ANIONGAP 13 02/15/2020    CBG (last 3)  Recent Labs    02/16/20 2102 02/17/20 0539 02/17/20 1109  GLUCAP 158* 141* 170*     GFR: Estimated Creatinine Clearance: 85.9 mL/min (by C-G formula based on SCr of 0.8 mg/dL).  Coagulation Profile: Recent Labs  Lab 02/14/20 1620  INR 1.0    Recent Results (from the past 240 hour(s))  SARS Coronavirus 2 by RT PCR (hospital order, performed in [facility] hospital lab) Nasopharyngeal Nasopharyngeal Swab     Status: None   Collection Time: 02/14/20  2:54 PM   Specimen: Nasopharyngeal Swab  Result Value Ref Range Status   SARS Coronavirus 2 NEGATIVE NEGATIVE Final  Comment: (NOTE) SARS-CoV-2 target nucleic acids are NOT DETECTED.  The SARS-CoV-2 RNA is generally detectable in upper and lower respiratory specimens during the acute phase of infection. The lowest concentration of SARS-CoV-2 viral copies this assay can detect is 250 copies / mL. A negative result does not preclude SARS-CoV-2 infection and should not be  used as the sole basis for treatment or other patient management decisions.  A negative result may occur with improper specimen collection / handling, submission of specimen other than nasopharyngeal swab, presence of viral mutation(s) within the areas targeted by this assay, and inadequate number of viral copies (<250 copies / mL). A negative result must be combined with clinical observations, patient history, and epidemiological information.  Fact Sheet for Patients:   StrictlyIdeas.no  Fact Sheet for Healthcare Providers: BankingDealers.co.za  This test is not yet approved or  cleared by the [place] FDA and has been authorized for detection and/or diagnosis of SARS-CoV-2 by FDA under an Emergency Use Authorization (EUA).  This EUA will remain in effect (meaning this test can be used) for the duration of the COVID-19 declaration under Section 564(b)(1) of the Act, 21 U.S.C. section 360bbb-3(b)(1), unless the authorization is terminated or revoked sooner.  Performed at [facility], [place] [address], [place], [place] [address]         Radiology Studies: No results found.      Scheduled Meds: . aspirin EC  81 mg Oral Daily  . insulin aspart  0-15 Units Subcutaneous TID WC  . insulin aspart  0-5 Units Subcutaneous QHS  . pantoprazole  40 mg Oral Q0600  . peg 3350 powder  0.5 kit Oral Once   And  . [START ON 02/18/2020] peg 3350 powder  0.5 kit Oral Once   Continuous Infusions: . sodium chloride       LOS: 3 days     Cordelia Poche, MD [facility] 02/17/2020, 1:28 PM  If 7PM-7AM, please contact night-coverage [email or web address]

## 2020-02-18 ENCOUNTER — Encounter (HOSPITAL_COMMUNITY): Payer: Self-pay | Admitting: Internal Medicine

## 2020-02-18 ENCOUNTER — Inpatient Hospital Stay (HOSPITAL_COMMUNITY): Payer: Medicare Other | Admitting: Certified Registered Nurse Anesthetist

## 2020-02-18 ENCOUNTER — Encounter (HOSPITAL_COMMUNITY)
Admission: EM | Disposition: A | Discharge status: Home / Self Care | Payer: Self-pay | Source: Home / Self Care | Attending: Family Medicine

## 2020-02-18 ENCOUNTER — Other Ambulatory Visit: Payer: Self-pay | Admitting: Internal Medicine

## 2020-02-18 ENCOUNTER — Telehealth: Payer: Self-pay

## 2020-02-18 DIAGNOSIS — E785 Hyperlipidemia, unspecified: Secondary | ICD-10-CM

## 2020-02-18 DIAGNOSIS — R195 Other fecal abnormalities: Secondary | ICD-10-CM

## 2020-02-18 DIAGNOSIS — D500 Iron deficiency anemia secondary to blood loss (chronic): Secondary | ICD-10-CM

## 2020-02-18 DIAGNOSIS — D509 Iron deficiency anemia, unspecified: Secondary | ICD-10-CM

## 2020-02-18 DIAGNOSIS — K5520 Angiodysplasia of colon without hemorrhage: Secondary | ICD-10-CM

## 2020-02-18 DIAGNOSIS — D122 Benign neoplasm of ascending colon: Secondary | ICD-10-CM

## 2020-02-18 DIAGNOSIS — K635 Polyp of colon: Secondary | ICD-10-CM

## 2020-02-18 HISTORY — PX: HOT HEMOSTASIS: SHX5433

## 2020-02-18 HISTORY — PX: HEMOSTASIS CLIP PLACEMENT: SHX6857

## 2020-02-18 HISTORY — PX: BIOPSY: SHX5522

## 2020-02-18 HISTORY — PX: SCHLEROTHERAPY: SHX5440

## 2020-02-18 HISTORY — PX: COLONOSCOPY WITH PROPOFOL: SHX5780

## 2020-02-18 LAB — CBC
HCT: 32.6 % — ABNORMAL LOW (ref 36.0–46.0)
Hemoglobin: 9.8 g/dL — ABNORMAL LOW (ref 12.0–15.0)
MCH: 24.4 pg — ABNORMAL LOW (ref 26.0–34.0)
MCHC: 30.1 g/dL (ref 30.0–36.0)
MCV: 81.3 fL (ref 80.0–100.0)
Platelets: 552 K/uL — ABNORMAL HIGH (ref 150–400)
RBC: 4.01 MIL/uL (ref 3.87–5.11)
RDW: 15.9 % — ABNORMAL HIGH (ref 11.5–15.5)
WBC: 10.7 K/uL — ABNORMAL HIGH (ref 4.0–10.5)
nRBC: 0 % (ref 0.0–0.2)

## 2020-02-18 LAB — TYPE AND SCREEN
ABO/RH(D): B POS
Antibody Screen: NEGATIVE
Unit division: 0
Unit division: 0

## 2020-02-18 LAB — BPAM RBC
Blood Product Expiration Date: 202108202359
Blood Product Expiration Date: 202109092359
ISSUE DATE / TIME: 202108061616
ISSUE DATE / TIME: 202108091145
Unit Type and Rh: 7300
Unit Type and Rh: 7300

## 2020-02-18 LAB — BASIC METABOLIC PANEL
Anion gap: 11 (ref 5–15)
BUN: 7 mg/dL — ABNORMAL LOW (ref 8–23)
CO2: 22 mmol/L (ref 22–32)
Calcium: 9.3 mg/dL (ref 8.9–10.3)
Chloride: 107 mmol/L (ref 98–111)
Creatinine, Ser: 0.86 mg/dL (ref 0.44–1.00)
GFR calc Af Amer: >60 mL/min (ref >60)
GFR calc non Af Amer: >60 mL/min (ref >60)
Glucose, Bld: 137 mg/dL — ABNORMAL HIGH (ref 70–99)
Potassium: 3.7 mmol/L (ref 3.5–5.1)
Sodium: 140 mmol/L (ref 135–145)

## 2020-02-18 LAB — GLUCOSE, CAPILLARY
Glucose-Capillary: 138 mg/dL — ABNORMAL HIGH (ref 70–99)
Glucose-Capillary: 152 mg/dL — ABNORMAL HIGH (ref 70–99)

## 2020-02-18 LAB — HEMOGLOBIN AND HEMATOCRIT, BLOOD
HCT: 30.5 % — ABNORMAL LOW (ref 36.0–46.0)
Hemoglobin: 9.1 g/dL — ABNORMAL LOW (ref 12.0–15.0)

## 2020-02-18 SURGERY — COLONOSCOPY WITH PROPOFOL
Anesthesia: Monitor Anesthesia Care

## 2020-02-18 MED ORDER — LIDOCAINE HCL (CARDIAC) PF 100 MG/5ML IV SOSY
PREFILLED_SYRINGE | INTRAVENOUS | Status: DC | PRN
  Administered 2020-02-18: 100 mg via INTRAVENOUS

## 2020-02-18 MED ORDER — ASPIRIN 81 MG PO TBEC: 81.0000 mg | DELAYED_RELEASE_TABLET | Freq: Every day | ORAL | Status: AC

## 2020-02-18 MED ORDER — PROPOFOL 10 MG/ML IV BOLUS
INTRAVENOUS | Status: DC | PRN
  Administered 2020-02-18 (×2): 30 mg via INTRAVENOUS

## 2020-02-18 MED ORDER — SODIUM CHLORIDE (PF) 0.9 % IJ SOLN
PREFILLED_SYRINGE | INTRAMUSCULAR | Status: DC | PRN
  Administered 2020-02-18: .5 mL

## 2020-02-18 MED ORDER — LACTATED RINGERS IV SOLN
INTRAVENOUS | Status: DC
  Administered 2020-02-18: 1000 mL via INTRAVENOUS

## 2020-02-18 MED ORDER — PHENYLEPHRINE HCL (PRESSORS) 10 MG/ML IV SOLN
INTRAVENOUS | Status: DC | PRN
  Administered 2020-02-18: 160 ug via INTRAVENOUS
  Administered 2020-02-18: 120 ug via INTRAVENOUS
  Administered 2020-02-18: 80 ug via INTRAVENOUS
  Administered 2020-02-18: 120 ug via INTRAVENOUS

## 2020-02-18 MED ORDER — EPINEPHRINE 1 MG/10ML IJ SOSY
PREFILLED_SYRINGE | INTRAMUSCULAR | Status: AC
  Filled 2020-02-18: qty 10

## 2020-02-18 MED ORDER — PROPOFOL 500 MG/50ML IV EMUL
INTRAVENOUS | Status: DC | PRN
  Administered 2020-02-18: 75 ug/kg/min via INTRAVENOUS

## 2020-02-18 MED ORDER — CLOPIDOGREL BISULFATE 75 MG PO TABS: 75.0000 mg | ORAL_TABLET | Freq: Every day | ORAL | Status: DC

## 2020-02-18 SURGICAL SUPPLY — 22 items

## 2020-02-18 NOTE — Discharge Instructions (Signed)
Gastrointestinal Bleeding Gastrointestinal (GI) bleeding is bleeding somewhere along the digestive tract, between the mouth and the anus. This tract includes the mouth, esophagus, stomach, small intestine, large intestine, and anus. The large intestine is often called the colon. GI bleeding can be caused by various problems. The severity of these problems can range from mild to serious or even life-threatening. If you have GI bleeding, you may find blood in your stools (feces), you may have black stools, or you may vomit blood. If there is a lot of bleeding, you may need to stay in the hospital. What are the causes? This condition may be caused by:  Inflammation, irritation, or swelling of the esophagus (esophagitis). The esophagus is part of the body that moves food from your mouth to your stomach.  Swollen veins in the rectum (hemorrhoids).  Areas of painful tearing in the anus that are often caused by passing hard stool (anal fissures).  Pouches that form on the colon over time, with age, and may bleed a lot (diverticulosis).  Inflammation (diverticulitis) in areas with diverticulosis. This can cause pain, fever, and bloody stools, although bleeding may be mild.  Growths (polyps) or cancer. Colon cancer often starts out as precancerous polyps.  Gastritis and ulcers. With these, bleeding may come from the upper GI tract, near the stomach. What increases the risk? You are more likely to develop this condition if you:  Have an infection in your stomach from a type of bacteria called Helicobacter pylori.  Take certain medicines, such as: ? NSAIDs. ? Aspirin. ? Selective serotonin reuptake inhibitors (SSRIs). ? Steroids. ? Antiplatelet or anticoagulant medicines.  Smoke.  Drink alcohol. What are the signs or symptoms? Common symptoms of this condition include:  Bright red blood in your vomit, or vomit that looks like coffee grounds.  Bloody, black, or tarry stools. ? Bleeding  from the lower GI tract will usually cause red or maroon blood in the stools. ? Bleeding from the upper GI tract may cause black, tarry stools that are often stronger smelling than usual. ? In certain cases, if the bleeding is fast enough, the stools may be red.  Pain or cramping in the abdomen. How is this diagnosed? This condition may be diagnosed based on:  Your medical history and a physical exam.  Various tests, such as: ? Blood tests. ? Stool tests. ? X-rays and other imaging tests. ? Esophagogastroduodenoscopy (EGD). In this test, a flexible, lighted tube is used to look at your esophagus, stomach, and small intestine. ? Colonoscopy. In this test, a flexible, lighted tube is used to look at your colon. How is this treated? Treatment for this condition depends on the cause of the bleeding. For example:  For bleeding from the esophagus, stomach, small intestine, or colon, the health care provider doing your EGD or colonoscopy may be able to stop the bleeding as part of the procedure.  Inflammation or infection of the colon can be treated with medicines.  Certain rectal problems can be treated with creams, suppositories, or warm baths.  Medicines may be given to reduce acid in your stomach.  Surgery is sometimes needed.  Blood transfusions are sometimes needed if a lot of blood has been lost. If bleeding is mild, you may be allowed to go home. If there is a lot of bleeding, you will need to stay in the hospital for observation. Follow these instructions at home:   Take over-the-counter and prescription medicines only as told by your health care provider.    Eat foods that are high in fiber, such as beans, whole grains, and fresh fruits and vegetables. This will help to keep your stools soft. Eating 1-3 prunes each day works well for many people.  Drink enough fluid to keep your urine pale yellow.  Keep all follow-up visits as told by your health care provider. This is  important. Contact a health care provider if:  Your symptoms do not improve. Get help right away if:  Your bleeding does not stop.  You feel light-headed or you faint.  You feel weak.  You have severe cramps in your back or abdomen.  You pass large blood clots in your stool.  Your symptoms are getting worse.  You have chest pain or fast heartbeats. Summary  Gastrointestinal (GI) bleeding is bleeding somewhere along the digestive tract, between the mouth and anus. GI bleeding can be caused by various problems. The severity of these problems can range from mild to serious or even life-threatening.  Treatment for this condition depends on the cause of the bleeding.  Take over-the-counter and prescription medicines only as told by your health care provider.  Keep all follow-up visits as told by your health care provider. This is important.  Get help right away if your bleeding increases, your symptoms are getting worse, or you have new symptoms. This information is not intended to replace advice given to you by your health care provider. Make sure you discuss any questions you have with your health care provider. Document Revised: 02/07/2018 Document Reviewed: 02/07/2018 Elsevier Patient Education  2020 [employer]vier Inc.  

## 2020-02-18 NOTE — Anesthesia Postprocedure Evaluation (Signed)
Anesthesia Post Note  Patient: Natasha Osborn  Procedure(s) Performed: COLONOSCOPY WITH PROPOFOL (N/A ) HOT HEMOSTASIS (ARGON PLASMA COAGULATION/BICAP) (N/A ) SCHLEROTHERAPY HEMOSTASIS CLIP PLACEMENT BIOPSY     Patient location during evaluation: Endoscopy Anesthesia Type: MAC Level of consciousness: awake and alert Pain management: pain level controlled Vital Signs Assessment: post-procedure vital signs reviewed and stable Respiratory status: spontaneous breathing, nonlabored ventilation and respiratory function stable Cardiovascular status: stable and blood pressure returned to baseline Postop Assessment: no apparent nausea or vomiting Anesthetic complications: no   No complications documented.  Last Vitals:  Vitals:   02/18/20 1059 02/18/20 1110  BP: (!) 94/54 123/61  Pulse: 86 87  Resp: 16 16  Temp: 36.8 C   SpO2: 100% 100%    Last Pain:  Vitals:   02/18/20 1110  TempSrc:   PainSc: 0-No pain                 Bharat Antillon,W. EDMOND

## 2020-02-18 NOTE — Interval H&P Note (Signed)
History and Physical Interval Note: For colonoscopy today to eval acute on chronic IDA with recent Plavix use after carotid stenting EGD neg on 02/15/20 The nature of the procedure, as well as the risks, benefits, and alternatives were carefully and thoroughly reviewed with the patient. Ample time for discussion and questions allowed. The patient understood, was satisfied, and agreed to proceed.    02/18/2020 10:03 AM  Natasha Osborn  has presented today for surgery, with the diagnosis of gastrointestinal bleeding, anemia.  The various methods of treatment have been discussed with the patient and family. After consideration of risks, benefits and other options for treatment, the patient has consented to  Procedure(s): COLONOSCOPY WITH PROPOFOL (N/A) as a surgical intervention.  The patient's history has been reviewed, patient examined, no change in status, stable for surgery.  I have reviewed the patient's chart and labs.  Questions were answered to the patient's satisfaction.     Lajuan Lines [name]

## 2020-02-18 NOTE — Discharge Summary (Signed)
Physician Discharge Summary  Natasha Osborn JEH:[id number] DOB: [birth year] DOA: 02/14/2020  PCP: Patient, No Pcp Per  Admit date: 02/14/2020 Discharge date: 02/18/2020  Admitted From: Home Disposition: Home  Recommendations for Outpatient Follow-up:  1. Follow up with gastroenterology in 1 week 2. Please obtain BMP/CBC in one week 3. Please follow up on the following pending results: Colon polyp biopsy  Home Health: None Equipment/Devices: None  Discharge Condition: Stable CODE STATUS: Full code Diet recommendation: Heart healthy   Brief/Interim Summary:  Admission HPI written by Mckinley Jewel, MD   Chief Complaint: Low hemoglobin  HPI: Natasha Osborn is a 64 y.o. female with medical history significant of hypertension, hyperlipidemia, diabetes mellitus, morbid obesity, right carotid artery stenosis status post stents placement in May 2021 on aspirin and Plavix presents to emergency department due to low hemoglobin.  Patient tells me that she had routine follow-up with her PCP and was found to have low hemoglobin of 7 therefore she was asked to go to the ER for further evaluation and management.  Her last hemoglobin was 12.0 which was checked in 10/25/2019.  She takes iron supplements every day.  She tells me that she had stent placement in right carotid artery in May 2021 and was placed on Plavix and since then she has noticed black stool.  Reports tiredness and generalized weakness however denies chest pain, shortness of breath, palpitation, leg swelling, fever, chills, nausea, vomiting, epigastric pain, over-the-counter use of NSAIDs, family history of stomach/colon cancer, night sweats, weight loss, decreased appetite.  She underwent colonoscopy in 2016 which shows polyps status post polypectomy.  Repeat colonoscopy recommended in 5 years.  She never had an EGD in the past.  She denies history of smoking, alcohol, licit drug use.  Her last dose of Plavix was last  night.   Hospital course:  Symptomatic anemia Acute blood loss anemia GI bleeding Patient presented with a hemoglobin of 7.9 and received 1 unit of PRBC for symptomatic anemia in setting of melena. EGD performed on 8/7 which was unremarkable for etiology of bleed. Hemoglobin continued to drift down with a low of 7.3 and another 1 unit of PRBC was administered. Hemoglobin rebounded to 9.7 and down to 9.1 prior to discharge. Colonoscopy performed on 8/10 significant for no active bleed. Biopsy of polyp was obtained and angiodysplasia noted. Patient to follow-up with GI as an outpatient  Diabetes mellitus, type 2 Patient is on Tradjenta and metformin.  AKI In setting of likely volume depletion. Resolved with fluids.  Essential hypertension Patient is on amlodipine, hydrochlorothiazide and lisinopril as an outpatient. Held on admission in setting of GI bleed. Blood pressure mostly controlled.  Carotid artery stenosis Patient is on aspirin and Plavix as an outpatient were held on admission. Aspirin restarted inpatient and Plavix to restart on day after discharge.  Thrombocytosis Noted. Outpatient follow-up.  Morbid obesity Body mass index is 48.99 kg/m.    Discharge Diagnoses:  Principal Problem:   Symptomatic anemia Active Problems:   Diabetes mellitus without complication (HCC)   Hypertension   AKI (acute kidney injury) ([place])   Thrombocytosis (HCC)   HLD (hyperlipidemia)   Carotid artery stenosis, unilateral, right   Gastrointestinal hemorrhage with melena   Iron deficiency anemia due to chronic blood loss   Heme positive stool   Angiodysplasia of colon   Benign neoplasm of ascending colon    Discharge Instructions   Allergies as of 02/18/2020      Reactions   Codeine  Nausea And Vomiting   Levaquin [levofloxacin] Nausea Only, Other (See Comments)   Joint pain      Medication List    TAKE these medications   amLODipine 10 MG tablet Commonly known as:  NORVASC Take 2.5 mg by mouth daily.   aspirin 81 MG EC tablet Take 1 tablet (81 mg total) by mouth daily. What changed: when to take this   Biotin 5 MG Caps Take 5 mg by mouth daily.   clopidogrel 75 MG tablet Commonly known as: PLAVIX Take 1 tablet (75 mg total) by mouth daily at 10 pm. Start taking on: February 19, 2020   ferrous sulfate 325 (65 FE) MG tablet Take 235 mg by mouth 2 (two) times daily.   hydrochlorothiazide 25 MG tablet Commonly known as: HYDRODIURIL Take 25 mg by mouth daily.   lisinopril 20 MG tablet Commonly known as: ZESTRIL Take 20 mg by mouth 2 (two) times daily.   metFORMIN 500 MG 24 hr tablet Commonly known as: GLUCOPHAGE-XR Take 500-1,000 mg by mouth See admin instructions. Take 2 tablets (1000mg ) by mouth in the morning, 1 tablet (500mg ) by mouth at 6PM, and 1 tablet (500mg ) by mouth at 10PM.   Tradjenta 5 MG Tabs tablet Generic drug: linagliptin Take 5 mg by mouth at bedtime as needed (high blood sugar).       Follow-up Information    Jackquline Denmark, MD Follow up.   Specialties: Gastroenterology, Internal Medicine Why: Biopsy result. Labs Contact information: [address] [place] [address] [phone]              Allergies  Allergen Reactions  . Codeine Nausea And Vomiting  . Levaquin [Levofloxacin] Nausea Only and Other (See Comments)    Joint pain    Consultations:  Gastroenterology   Procedures/Studies: No results found.  EGD (02-27-20) Impression: - Normal UGI tract. - These findings do not explain her hemocult  positive dark stools with IDA. Recommendation: - Return patient to hospital ward for ongoing care. - We will follow along, will plan for colonoscopy  after full plavix washout (last dose was 8/5, so  looking at [facility] on  Tuesday August 10th). OK  to resume ASA 81mg  BID as she has been taking (I  will reorder) - [facility] team should determine if she needs both  ASA AND plavix going forward. - OK to resume heart healthy diet for now.  COLONOSCOPY (02/18/2020) Impression:               - The examined portion of the ileum was normal.                           - A single colonic angioectasia. Injected and then                            ablated with argon plasma coagulation (APC). Clip                            was placed.                           - One 2 mm polyp in the ascending colon, removed  with a cold biopsy forceps. Resected and retrieved.                           - Diverticulosis in the sigmoid colon.                           - Internal hemorrhoids.                           - The examination was otherwise normal.  Recommendation:           - Return patient to hospital ward for ongoing care.                           - Resume previous diet.                           - Continue present medications.                           - Resume Plavix (clopidogrel) at prior dose                            tomorrow. Refer to managing physician for further                            adjustment of therapy.                           - Await pathology results.                           - Replace iron.                           - Followup iron studies and CBC (CBC about 1 week                            after discharge) to ensure stability after resuming                            Plavix.                           - Arrange office follow-up with Dr. Lyndel Safe.                           - If recurrent heme + stools or further decline in                            Hgb, then video capsule endoscopy is recommended.                           - Repeat colonoscopy is  recommended. The                            colonoscopy  date will be determined after pathology                            results from today's exam become available for                            review.                           - Okay for discharge later today if uneventful                            recovery.   Subjective: No melena overnight.  Discharge Exam: Vitals:   02/18/20 1120 02/18/20 1137  BP: 140/71 (!) 122/58  Pulse: 83 84  Resp: 16 16  Temp:  98.8 F (37.1 C)  SpO2: 99% 100%   Vitals:   02/18/20 1059 02/18/20 1110 02/18/20 1120 02/18/20 1137  BP: (!) 94/54 123/61 140/71 (!) 122/58  Pulse: 86 87 83 84  Resp: 16 16 16 16   Temp: 98.2 F (36.8 C)   98.8 F (37.1 C)  TempSrc: Oral   Oral  SpO2: 100% 100% 99% 100%  Weight:      Height:        General: Pt is alert, awake, not in acute distress Cardiovascular: RRR, S1/S2 +, no rubs, no gallops Respiratory: CTA bilaterally, no wheezing, no rhonchi Abdominal: Soft, NT, ND, bowel sounds + Extremities: B/L 2+ foot edema, no cyanosis    The results of significant diagnostics from this hospitalization (including imaging, microbiology, ancillary and laboratory) are listed below for reference.     Microbiology: Recent Results (from the past 240 hour(s))  SARS Coronavirus 2 by RT PCR (hospital order, performed in [facility] hospital lab) Nasopharyngeal Nasopharyngeal Swab     Status: None   Collection Time: 02/14/20  2:54 PM   Specimen: Nasopharyngeal Swab  Result Value Ref Range Status   SARS Coronavirus 2 NEGATIVE NEGATIVE Final    Comment: (NOTE) SARS-CoV-2 target nucleic acids are NOT DETECTED.  The SARS-CoV-2 RNA is generally detectable in upper and lower respiratory specimens during the acute phase of infection. The lowest concentration of SARS-CoV-2 viral copies this assay can detect is 250 copies / mL. A negative result does not preclude SARS-CoV-2 infection and should not be used as the sole basis  for treatment or other patient management decisions.  A negative result may occur with improper specimen collection / handling, submission of specimen other than nasopharyngeal swab, presence of viral mutation(s) within the areas targeted by this assay, and inadequate number of viral copies (<250 copies / mL). A negative result must be combined with clinical observations, patient history, and epidemiological information.  Fact Sheet for Patients:   StrictlyIdeas.no  Fact Sheet for Healthcare Providers: BankingDealers.co.za  This test is not yet approved or  cleared by the [place] FDA and has been authorized for detection and/or diagnosis of SARS-CoV-2 by FDA under an Emergency Use Authorization (EUA).  This EUA will remain in effect (meaning this test can be used) for the duration of the COVID-19 declaration under Section 564(b)(1) of the Act, 21 U.S.C. section 360bbb-3(b)(1), unless the authorization is terminated or revoked sooner.  Performed at [facility], [place] [address] [place], [place] [address]      Labs: BNP (last 3  results) No results for input(s): BNP in the last 8760 hours. Basic Metabolic Panel: Recent Labs  Lab 02/14/20 1159 02/14/20 1620 02/15/20 0747 02/18/20 0338  NA 139  --  141 140  K 3.9  --  3.7 3.7  CL 104  --  107 107  CO2 20*  --  21* 22  GLUCOSE 141*  --  144* 137*  BUN 10  --  8 7*  CREATININE 1.02*  --  0.80 0.86  CALCIUM 9.2  --  9.2 9.3  MG  --  2.0  --   --    Liver Function Tests: Recent Labs  Lab 02/14/20 1159 02/15/20 0747  AST 22 16  ALT 17 15  ALKPHOS 63 53  BILITOT 0.5 1.0  PROT 7.3 6.6  ALBUMIN 3.7 3.2*   No results for input(s): LIPASE, AMYLASE in the last 168 hours. No results for input(s): AMMONIA in the last 168 hours. CBC: Recent Labs  Lab 02/14/20 1159 02/14/20 1159 02/15/20 0747 02/15/20 0747 02/16/20 0913 02/16/20 1534 02/16/20 2054  02/17/20 0101 02/17/20 1900 02/18/20 0338 02/18/20 1238  WBC 7.7  --  6.8  --  8.0  --   --  8.7  --  10.7*  --   HGB 7.9*   < > 8.5*   < > 7.9*   < > 7.6* 7.3* 9.7* 9.8* 9.1*  HCT 28.2*   < > 28.6*   < > 27.7*   < > 26.3* 25.0* 32.3* 32.6* 30.5*  MCV 84.2  --  80.8  --  81.2  --   --  81.2  --  81.3  --   PLT 549*  --  526*  --  499*  --   --  494*  --  552*  --    < > = values in this interval not displayed.   Cardiac Enzymes: No results for input(s): CKTOTAL, CKMB, CKMBINDEX, TROPONINI in the last 168 hours. BNP: Invalid input(s): POCBNP CBG: Recent Labs  Lab 02/17/20 1109 02/17/20 1603 02/17/20 2048 02/18/20 0617 02/18/20 1133  GLUCAP 170* 144* 112* 138* 152*   D-Dimer No results for input(s): DDIMER in the last 72 hours. Hgb A1c No results for input(s): HGBA1C in the last 72 hours. Lipid Profile No results for input(s): CHOL, HDL, LDLCALC, TRIG, CHOLHDL, LDLDIRECT in the last 72 hours. Thyroid function studies No results for input(s): TSH, T4TOTAL, T3FREE, THYROIDAB in the last 72 hours.  Invalid input(s): FREET3 Anemia work up No results for input(s): VITAMINB12, FOLATE, FERRITIN, TIBC, IRON, RETICCTPCT in the last 72 hours. Urinalysis    Component Value Date/Time   COLORURINE STRAW (A) 02/14/2020 1829   APPEARANCEUR CLEAR 02/14/2020 1829   LABSPEC 1.006 02/14/2020 1829   PHURINE 6.0 02/14/2020 1829   GLUCOSEU NEGATIVE 02/14/2020 [place] NEGATIVE 02/14/2020 [place] NEGATIVE 02/14/2020 [place] NEGATIVE 02/14/2020 1829   PROTEINUR NEGATIVE 02/14/2020 1829   NITRITE NEGATIVE 02/14/2020 1829   LEUKOCYTESUR NEGATIVE 02/14/2020 1829   Sepsis Labs Invalid input(s): PROCALCITONIN,  WBC,  LACTICIDVEN Microbiology Recent Results (from the past 240 hour(s))  SARS Coronavirus 2 by RT PCR (hospital order, performed in [place] hospital lab) Nasopharyngeal Nasopharyngeal Swab     Status: None   Collection Time: 02/14/20  2:54 PM    Specimen: Nasopharyngeal Swab  Result Value Ref Range Status   SARS Coronavirus 2 NEGATIVE NEGATIVE Final    Comment: (NOTE) SARS-CoV-2 target nucleic acids are NOT DETECTED.  The SARS-CoV-2 RNA is generally detectable in upper and lower respiratory specimens during the acute phase of infection. The lowest concentration of SARS-CoV-2 viral copies this assay can detect is 250 copies / mL. A negative result does not preclude SARS-CoV-2 infection and should not be used as the sole basis for treatment or other patient management decisions.  A negative result may occur with improper specimen collection / handling, submission of specimen other than nasopharyngeal swab, presence of viral mutation(s) within the areas targeted by this assay, and inadequate number of viral copies (<250 copies / mL). A negative result must be combined with clinical observations, patient history, and epidemiological information.  Fact Sheet for Patients:   StrictlyIdeas.no  Fact Sheet for Healthcare Providers: BankingDealers.co.za  This test is not yet approved or  cleared by the [place] FDA and has been authorized for detection and/or diagnosis of SARS-CoV-2 by FDA under an Emergency Use Authorization (EUA).  This EUA will remain in effect (meaning this test can be used) for the duration of the COVID-19 declaration under Section 564(b)(1) of the Act, 21 U.S.C. section 360bbb-3(b)(1), unless the authorization is terminated or revoked sooner.  Performed at [facility], [place] [address], [place], [place] [address]      Time coordinating discharge: 35 minutes  SIGNED:   Cordelia Poche, MD [facility] 02/18/2020, 3:11 PM

## 2020-02-18 NOTE — Anesthesia Preprocedure Evaluation (Signed)
Anesthesia Evaluation  Patient identified by MRN, date of birth, ID band Patient awake    Reviewed: Allergy & Precautions, H&P , NPO status , Patient's Chart, lab work & pertinent test results  Airway Mallampati: II  TM Distance: >3 FB Neck ROM: Full    Dental no notable dental hx. (+) Teeth Intact, Dental Advisory Given   Pulmonary neg pulmonary ROS,    Pulmonary exam normal breath sounds clear to auscultation       Cardiovascular hypertension, + Peripheral Vascular Disease   Rhythm:Regular Rate:Normal     Neuro/Psych negative neurological ROS  negative psych ROS   GI/Hepatic negative GI ROS, Neg liver ROS,   Endo/Other  diabetes, Type 2, Oral Hypoglycemic AgentsMorbid obesity  Renal/GU Renal disease  negative genitourinary   Musculoskeletal   Abdominal   Peds  Hematology  (+) Blood dyscrasia, anemia ,   Anesthesia Other Findings   Reproductive/Obstetrics negative OB ROS                             Anesthesia Physical Anesthesia Plan  ASA: III  Anesthesia Plan: MAC   Post-op Pain Management:    Induction: Intravenous  PONV Risk Score and Plan: 2 and Propofol infusion and Treatment may vary due to age or medical condition  Airway Management Planned: Nasal Cannula  Additional Equipment:   Intra-op Plan:   Post-operative Plan:   Informed Consent: I have reviewed the patients History and Physical, chart, labs and discussed the procedure including the risks, benefits and alternatives for the proposed anesthesia with the patient or authorized representative who has indicated his/her understanding and acceptance.     Dental advisory given  Plan Discussed with: CRNA  Anesthesia Plan Comments:         Anesthesia Quick Evaluation

## 2020-02-18 NOTE — Telephone Encounter (Signed)
Spoke to [place] at Dr Willette Pa office to inform her that patient will have labs done at there office on 02/25/20 per Dr Willis Modena colonoscopy and discharge orders.Lab order has been faxed to there office with instructions to fax results to our office. A follow up appointment with Dr Lyndel Safe will be scheduled for a 3-6 week follow up.

## 2020-02-18 NOTE — Transfer of Care (Signed)
Immediate Anesthesia Transfer of Care Note  Patient: Natasha Osborn  Procedure(s) Performed: COLONOSCOPY WITH PROPOFOL (N/A ) HOT HEMOSTASIS (ARGON PLASMA COAGULATION/BICAP) (N/A ) SCHLEROTHERAPY HEMOSTASIS CLIP PLACEMENT BIOPSY  Patient Location: PACU and Endoscopy Unit  Anesthesia Type:MAC  Level of Consciousness: drowsy  Airway & Oxygen Therapy: Patient Spontanous Breathing and Patient connected to nasal cannula oxygen  Post-op Assessment: Report given to RN, Post -op Vital signs reviewed and stable and Patient moving all extremities  Post vital signs: Reviewed and stable  Last Vitals:  Vitals Value Taken Time  BP 94/54 02/18/20 1059  Temp 36.8 C 02/18/20 1059  Pulse 86 02/18/20 1059  Resp 16 02/18/20 1059  SpO2 100 % 02/18/20 1059    Last Pain:  Vitals:   02/18/20 1059  TempSrc: Oral  PainSc: 0-No pain         Complications: No complications documented.

## 2020-02-18 NOTE — Op Note (Signed)
[facility] Patient Name: Natasha Osborn Procedure Date : 02/18/2020 MRN: [id number] Attending MD: Jerene Bears , MD Date of Birth: 1956/02/01 CSN: [id number] Age: 64 Admit Type: Inpatient Procedure:                Colonoscopy Indications:              Heme positive stool, acute on chronic iron                            deficiency anemia, recent Plavix initiation after                            carotid stenting, last colonoscopy April 2016 with                            2 polyps (<1cm) removed by Dr. Lyndel Safe; EGD normal on                            02/15/20 Providers:                Lajuan Lines. Hilarie Fredrickson, MD, Josie Dixon, RN, Cherylynn Ridges, Technician, Raphael Gibney, CRNA Referring MD:             [facility] Medicines:                Monitored Anesthesia Care Complications:            No immediate complications. Estimated Blood Loss:     Estimated blood loss was minimal. Procedure:                Pre-Anesthesia Assessment:                           - Prior to the procedure, a History and Physical                            was performed, and patient medications and                            allergies were reviewed. The patient's tolerance of                            previous anesthesia was also reviewed. The risks                            and benefits of the procedure and the sedation                            options and risks were discussed with the patient.                            All questions were answered, and informed consent  was obtained. Prior Anticoagulants: The patient has                            taken Plavix (clopidogrel), last dose was 5 days                            prior to procedure. ASA Grade Assessment: III - A                            patient with severe systemic disease. After                            reviewing the risks and benefits, the patient was                             deemed in satisfactory condition to undergo the                            procedure.                           After obtaining informed consent, the colonoscope                            was passed under direct vision. Throughout the                            procedure, the patient's blood pressure, pulse, and                            oxygen saturations were monitored continuously. The                            PCF-H190DL ([id number] Olympus pediatric colonoscope                            was introduced through the anus and advanced to the                            terminal ileum. The colonoscopy was performed                            without difficulty. The patient tolerated the                            procedure well. The quality of the bowel                            preparation was good. The terminal ileum, ileocecal                            valve, appendiceal orifice, and rectum were  photographed. Scope In: 10:24:45 AM Scope Out: 10:51:37 AM Scope Withdrawal Time: 0 hours 23 minutes 3 seconds  Total Procedure Duration: 0 hours 26 minutes 52 seconds  Findings:      The terminal ileum appeared normal.      A single medium-sized angioectasia with bleeding on contact was found at       the ileocecal valve. Area immediately adjacent successfully injected       with 0.5 mL of a 1:10,000 solution of epinephrine in attempt to reduce       risk for bleeding after destruction (by lifting the lesion prior to       destruction). Fulguration to ablate the lesion by argon plasma at 0.5       liters/minute and 20 watts was successful. To further prevent bleeding       post-intervention given the need to resume Plavix, one hemostatic clip       was successfully placed. There was no bleeding at the end of the       maneuver.      A 2 mm polyp was found in the ascending colon. The polyp was sessile.       The polyp was removed with a cold biopsy  forceps. Resection and       retrieval were complete.      A few small-mouthed diverticula were found in the sigmoid colon.      Internal hemorrhoids were found during retroflexion. The hemorrhoids       were small.      The exam was otherwise without abnormality. Impression:               - The examined portion of the ileum was normal.                           - A single colonic angioectasia. Injected and then                            ablated with argon plasma coagulation (APC). Clip                            was placed.                           - One 2 mm polyp in the ascending colon, removed                            with a cold biopsy forceps. Resected and retrieved.                           - Diverticulosis in the sigmoid colon.                           - Internal hemorrhoids.                           - The examination was otherwise normal. Moderate Sedation:      N/A Recommendation:           - Return patient to hospital ward for ongoing care.                           -  Resume previous diet.                           - Continue present medications.                           - Resume Plavix (clopidogrel) at prior dose                            tomorrow. Refer to managing physician for further                            adjustment of therapy.                           - Await pathology results.                           - Replace iron.                           - Followup iron studies and CBC (CBC about 1 week                            after discharge) to ensure stability after resuming                            Plavix.                           - Arrange office follow-up with Dr. Lyndel Safe.                           - If recurrent heme + stools or further decline in                            Hgb, then video capsule endoscopy is recommended.                           - Repeat colonoscopy is recommended. The                            colonoscopy date will be determined  after pathology                            results from today's exam become available for                            review.                           - Okay for discharge later today if uneventful                            recovery. Procedure Code(s):        --- Professional ---  95638, 59, Colonoscopy, flexible; with control of                            bleeding, any method                           45381, 59, Colonoscopy, flexible; with directed                            submucosal injection(s), any substance                           45380, Colonoscopy, flexible; with biopsy, single                            or multiple Diagnosis Code(s):        --- Professional ---                           K64.8, Other hemorrhoids                           K55.20, Angiodysplasia of colon without hemorrhage                           K63.5, Polyp of colon                           R19.5, Other fecal abnormalities                           D50.9, Iron deficiency anemia, unspecified                           K57.30, Diverticulosis of large intestine without                            perforation or abscess without bleeding CPT copyright 2019 American [employer]. All rights reserved. The codes documented in this report are preliminary and upon coder review may  be revised to meet current compliance requirements. Jerene Bears, MD 02/18/2020 11:08:09 AM This report has been signed electronically. Number of Addenda: 0

## 2020-02-18 NOTE — Progress Notes (Signed)
RN gave pt discharge instructions and pt stated understanding. IV has been removed and belongings have been packed husband at bedside for discharge.

## 2020-02-18 NOTE — TOC Transition Note (Signed)
Transition of Care [facility]) - CM/SW Discharge Note   Patient Details  Name: Natasha Osborn MRN: [id number] Date of Birth: 28-Sep-1955  [facility] CM/SW Contact:  Pollie Friar, RN Phone Number: 02/18/2020, 3:16 PM   Clinical Narrative:    Pt discharging home with self care. Pts spouse can provide supervision at home and transportation to home.    Final next level of care: Home/Self Care Barriers to Discharge: No Barriers Identified   Patient Goals and CMS Choice        Discharge Placement                       Discharge Plan and Services                                     Social Determinants of Health (SDOH) Interventions     Readmission Risk Interventions No flowsheet data found.

## 2020-02-19 ENCOUNTER — Encounter: Payer: Self-pay | Admitting: Gastroenterology

## 2020-02-20 ENCOUNTER — Encounter (HOSPITAL_COMMUNITY): Payer: Self-pay | Admitting: Internal Medicine

## 2020-02-20 LAB — SURGICAL PATHOLOGY

## 2020-02-25 ENCOUNTER — Telehealth: Payer: Self-pay

## 2020-02-25 ENCOUNTER — Encounter: Payer: Self-pay | Admitting: Internal Medicine

## 2020-02-25 NOTE — Telephone Encounter (Signed)
Spoke to Natasha Osborn at Dr Marguerite Olea office, she confirmed that patient had labs done today per Dr pyrtle's discharge  Note from 02/15/20 colonoscopy. The office will fax the results to [place] as soon as they receive them. Fax number provided.

## 2020-03-24 ENCOUNTER — Encounter: Payer: Self-pay | Admitting: Gastroenterology

## 2020-03-24 ENCOUNTER — Ambulatory Visit: Payer: Medicare Other | Admitting: Gastroenterology

## 2020-03-24 VITALS — BP 126/78 | HR 93 | Ht 61.0 in | Wt 256.2 lb

## 2020-03-24 DIAGNOSIS — K921 Melena: Secondary | ICD-10-CM

## 2020-03-24 DIAGNOSIS — D509 Iron deficiency anemia, unspecified: Secondary | ICD-10-CM | POA: Diagnosis not present

## 2020-03-24 NOTE — Patient Instructions (Signed)
If you are age 63 or older, your body mass index should be between 23-30. Your Body mass index is 48.42 kg/m. If this is out of the aforementioned range listed, please consider follow up with your Primary Care Provider.  If you are age 44 or younger, your body mass index should be between 19-25. Your Body mass index is 48.42 kg/m. If this is out of the aformentioned range listed, please consider follow up with your Primary Care Provider.   You have been scheduled for a CT scan of the abdomen and pelvis at [facility].  You are scheduled on 04/02/20 at 8:30am. You should arrive 15 minutes prior to your appointment time for registration. Nothing to eat or drink 4 hours before your appointment.  Please follow the written instructions below on the day of your exam: If you need to reschedule please call [phone].  Your doctor would like you to have a Capsule Endoscopy, you will be contacted with this appointment.   Please go to the lab at [facility] ([place] You will need to go to level "B", you do not need an appointment for this. Hours available are 7:30 am - 4:30 pm.   Thank you,  Dr. Jackquline Denmark

## 2020-03-24 NOTE — Progress Notes (Signed)
Chief Complaint:   Referring Provider:  No ref. provider found      ASSESSMENT AND PLAN;   #1.  IDA with melena. Neg EGD 02/2020. 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  #2. Carotid artery stenosis s/p stent on ASA/plavix, DM2, HLD, obesity, CAD.   Plan: -CBC, CMP, iron studies -CTA -VCE in 3-4 weeks -If needed, IV iron.  However, continue p.o. iron for now. -Continue ASA/plavix for now.  Does have appt coming up with neurology.  She will discuss monotherapy with them. -D/W pt and pt's husband.   HPI:    Natasha Osborn is a 64 y.o. 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  Repeat hemoglobin was 10 with low MCV on 02/25/2020.  Lately has been having "melena" -has been on iron supplements as well.  She was told to follow-up in GI  Still feels weak and tired.  Continues to crave ice, more prominently over the last 2 months.  No nausea, vomiting, heartburn, regurgitation, odynophagia or dysphagia.  No significant diarrhea. No melena or hematochezia. No unintentional weight loss. No abdominal pain.  Iron makes her have constipation  Craves ice x 2 months    Colon 02/18/2020 - The examined portion of the ileum was normal. - A single colonic angioectasia. Injected and then ablated with argon plasma coagulation (APC). Clip was placed. - One 2 mm polyp in the ascending colon, removed with a cold biopsy forceps. Resected and retrieved. Bx- neg. Rpt 7-10 yrs. - Diverticulosis in the sigmoid colon. - Internal hemorrhoids. - The examination was otherwise normal.  EGD 02/15/2020: Normal.  Past Medical History:  Diagnosis Date  . Anemia   . Diabetes mellitus without complication ([place])   . Family history of colonic polyps   . Hypertension     Past Surgical History:  Procedure Laterality Date  . BIOPSY  02/18/2020   Procedure: BIOPSY;  Surgeon:  Jerene Bears, MD;  Location: [place];  Service: Gastroenterology;;  . CAROTID STENT INSERTION    . COLONOSCOPY  10/27/2014   Colonic polyps status post polypectomy. Mild melanosis coli  . COLONOSCOPY WITH PROPOFOL N/A 02/18/2020   Procedure: COLONOSCOPY WITH PROPOFOL;  Surgeon: Jerene Bears, MD;  Location: [place];  Service: Gastroenterology;  Laterality: N/A;  . ESOPHAGOGASTRODUODENOSCOPY (EGD) WITH PROPOFOL N/A 02/15/2020   Procedure: ESOPHAGOGASTRODUODENOSCOPY (EGD) WITH PROPOFOL;  Surgeon: Milus Banister, MD;  Location: [facility] ENDOSCOPY;  Service: Endoscopy;  Laterality: N/A;  . HEMOSTASIS CLIP PLACEMENT  02/18/2020   Procedure: HEMOSTASIS CLIP PLACEMENT;  Surgeon: Jerene Bears, MD;  Location: [place] ENDOSCOPY;  Service: Gastroenterology;;  . HOT HEMOSTASIS N/A 02/18/2020   Procedure: HOT HEMOSTASIS (ARGON PLASMA COAGULATION/BICAP);  Surgeon: Jerene Bears, MD;  Location: [facility] ENDOSCOPY;  Service: Gastroenterology;  Laterality: N/A;  . OVARIAN CYST REMOVAL Left   . SCHLEROTHERAPY  02/18/2020   Procedure: Woodward Ku;  Surgeon: Jerene Bears, MD;  Location: [facility] ENDOSCOPY;  Service: Gastroenterology;;    Family History  Problem Relation Age of Onset  . Colon polyps Mother        dx 70's  . Colon cancer Neg Hx     Social History   Tobacco Use  . Smoking status: Never Smoker  . Smokeless tobacco: Never Used  Substance Use Topics  . Alcohol use: Never  . Drug use: Never    Current Outpatient Medications  Medication Sig Dispense Refill  . amLODipine (NORVASC) 10 MG tablet Take 2.5 mg by mouth daily.    Marland Kitchen aspirin 81 MG EC tablet Take 1 tablet (81 mg total) by mouth daily.    . Biotin 5 MG CAPS Take 5 mg by mouth daily.    . clopidogrel (PLAVIX) 75 MG tablet Take 1 tablet (75 mg total) by mouth daily at 10 pm.    . ferrous sulfate 325 (65 FE) MG tablet Take 235 mg by mouth 2 (two) times daily.     . hydrochlorothiazide (HYDRODIURIL) 25 MG tablet Take 25 mg by mouth daily.    Marland Kitchen  lisinopril (ZESTRIL) 20 MG tablet Take 20 mg by mouth 2 (two) times daily.    . metFORMIN (GLUCOPHAGE-XR) 500 MG 24 hr tablet Take 500-1,000 mg by mouth See admin instructions. Take 2 tablets (1000mg ) by mouth in the morning, 1 tablet (500mg ) by mouth at 6PM, and 1 tablet (500mg ) by mouth at 10PM.    . TRADJENTA 5 MG TABS tablet Take 5 mg by mouth at bedtime as needed (high blood sugar).      No current facility-administered medications for this visit.    Allergies  Allergen Reactions  . Codeine Nausea And Vomiting  . Levaquin [Levofloxacin] Nausea Only and Other (See Comments)    Joint pain    Review of Systems:  Constitutional: Denies fever, chills, diaphoresis, appetite change and fatigue.  HEENT: Denies photophobia, eye pain, redness, hearing loss, ear pain, congestion, sore throat, rhinorrhea, sneezing, mouth sores, neck pain, neck stiffness and tinnitus.   Respiratory: Denies SOB, DOE, cough, chest tightness,  and wheezing.   Cardiovascular: Denies chest pain, palpitations and leg swelling.  Genitourinary: Denies dysuria, urgency, frequency, hematuria, flank pain and difficulty urinating.  Musculoskeletal: Denies myalgias, back pain, joint swelling, arthralgias and gait problem.  Skin: No rash.  Neurological: Denies dizziness, seizures, syncope, weakness, light-headedness, numbness and headaches.  Hematological: Denies adenopathy. Easy bruising, personal or family bleeding history  Psychiatric/Behavioral: No anxiety or depression     Physical Exam:    BP 126/78   Pulse 93   Ht 5\' 1"  (1.549 m)   Wt 256 lb 4 oz (116.2 kg)   BMI 48.42 kg/m  Wt Readings from Last 3 Encounters:  03/24/20 256 lb 4 oz (116.2 kg)  02/18/20 259 lb 4.2 oz (117.6 kg)   Constitutional:  Well-developed, in no acute distress. Psychiatric: Normal mood and affect. Behavior is normal. HEENT: Pupils normal.  Conjunctivae are normal. No scleral icterus. Neck supple.  Cardiovascular: Normal rate,  regular rhythm. No edema Pulmonary/chest: Effort normal and breath sounds normal. No wheezing, rales or rhonchi. Abdominal: Soft, nondistended. Nontender. Bowel sounds active throughout. There are no masses palpable. No hepatomegaly. Rectal:  defered Neurological: Alert and oriented to person place and time. Skin: Skin is warm and dry. No rashes noted.  Data Reviewed: I have personally reviewed following labs and imaging studies  CBC: CBC Latest Ref Rng & Units 02/18/2020 02/18/2020 02/17/2020  WBC 4.0 - 10.5 K/uL - 10.7(H) -  Hemoglobin 12.0 - 15.0 g/dL 9.1(L) 9.8(L) 9.7(L)  Hematocrit 36 - 46 % 30.5(L) 32.6(L) 32.3(L)  Platelets 150 - 400 K/uL - 552(H) -    CMP: CMP Latest Ref Rng & Units 02/18/2020 02/15/2020 02/14/2020  Glucose 70 - 99 mg/dL 137(H) 144(H) 141(H)  BUN 8 - 23 mg/dL 7(L) 8 10  Creatinine 0.44 - 1.00 mg/dL 0.86 0.80 1.02(H)  Sodium 135 - 145 mmol/L 140 141 139  Potassium 3.5 - 5.1 mmol/L 3.7 3.7 3.9  Chloride 98 - 111 mmol/L 107 107 104  CO2 22 - 32 mmol/L 22 21(L) 20(L)  Calcium 8.9 - 10.3 mg/dL 9.3 9.2 9.2  Total Protein 6.5 - 8.1 g/dL - 6.6 7.3  Total Bilirubin 0.3 - 1.2 mg/dL - 1.0 0.5  Alkaline Phos 38 - 126 U/L - 53 63  AST 15 - 41 U/L - 16 22  ALT 0 - 44 U/L - 15 17    GFR: CrCl cannot be calculated (Patient's most recent lab result is older than the maximum 21 days allowed.). Liver Function Tests: No results for input(s): AST, ALT, ALKPHOS, BILITOT, PROT, ALBUMIN in the last 168 hours. No results for input(s): LIPASE, AMYLASE in the last 168 hours. No results for input(s): AMMONIA in the last 168 hours. Coagulation Profile: No results for input(s): INR, PROTIME in the last 168 hours. HbA1C: No results for input(s): HGBA1C in the last 72 hours. Lipid Profile: No results for input(s): CHOL, HDL, LDLCALC, TRIG, CHOLHDL, LDLDIRECT in the last 72 hours. Thyroid Function Tests: No results for input(s): TSH, T4TOTAL, FREET4, T3FREE, THYROIDAB in the last 72  hours. Anemia Panel: No results for input(s): VITAMINB12, FOLATE, FERRITIN, TIBC, IRON, RETICCTPCT in the last 72 hours.  No results found for this or any previous visit (from the past 240 hour(s)).    Radiology Studies: No results found.    Carmell Austria, MD 03/24/2020, 1:29 PM  Cc: No ref. provider found

## 2020-04-02 ENCOUNTER — Ambulatory Visit (HOSPITAL_COMMUNITY): Payer: Medicare Other

## 2020-04-07 ENCOUNTER — Telehealth: Payer: Self-pay | Admitting: Gastroenterology

## 2020-04-07 NOTE — Telephone Encounter (Signed)
Pt is wanting to know if she can obtain her labs at [facility] in [place] since that is closer to her home.

## 2020-04-07 NOTE — Telephone Encounter (Signed)
Spoke to patient to inform her that it is advised to have her labs done at [place] it is more accessible to tract the results. Patient voiced understanding and will have her labs done at the designated location.

## 2020-04-13 ENCOUNTER — Other Ambulatory Visit (INDEPENDENT_AMBULATORY_CARE_PROVIDER_SITE_OTHER): Payer: Medicare Other

## 2020-04-13 DIAGNOSIS — K921 Melena: Secondary | ICD-10-CM

## 2020-04-13 DIAGNOSIS — D509 Iron deficiency anemia, unspecified: Secondary | ICD-10-CM

## 2020-04-13 LAB — COMPREHENSIVE METABOLIC PANEL
ALT: 16 U/L (ref 0–35)
AST: 15 U/L (ref 0–37)
Albumin: 4.1 g/dL (ref 3.5–5.2)
Alkaline Phosphatase: 71 U/L (ref 39–117)
BUN: 12 mg/dL (ref 6–23)
CO2: 26 mEq/L (ref 19–32)
Calcium: 9.3 mg/dL (ref 8.4–10.5)
Chloride: 102 mEq/L (ref 96–112)
Creatinine, Ser: 0.87 mg/dL (ref 0.40–1.20)
GFR: 65.69 mL/min (ref >60.00)
Glucose, Bld: 147 mg/dL — ABNORMAL HIGH (ref 70–99)
Potassium: 4.3 mEq/L (ref 3.5–5.1)
Sodium: 136 mEq/L (ref 135–145)
Total Bilirubin: 0.3 mg/dL (ref 0.2–1.2)
Total Protein: 7.5 g/dL (ref 6.0–8.3)

## 2020-04-13 LAB — CBC WITH DIFFERENTIAL/PLATELET
Basophils Absolute: 0.1 10*3/uL (ref 0.0–0.1)
Basophils Relative: 1.2 % (ref 0.0–3.0)
Eosinophils Absolute: 0.2 10*3/uL (ref 0.0–0.7)
Eosinophils Relative: 2 % (ref 0.0–5.0)
HCT: 35.5 % — ABNORMAL LOW (ref 36.0–46.0)
Hemoglobin: 11.2 g/dL — ABNORMAL LOW (ref 12.0–15.0)
Lymphocytes Relative: 24.8 % (ref 12.0–46.0)
Lymphs Abs: 2.3 10*3/uL (ref 0.7–4.0)
MCHC: 31.5 g/dL (ref 30.0–36.0)
MCV: 75.4 fl — ABNORMAL LOW (ref 78.0–100.0)
Monocytes Absolute: 0.7 10*3/uL (ref 0.1–1.0)
Monocytes Relative: 7.7 % (ref 3.0–12.0)
Neutro Abs: 5.9 10*3/uL (ref 1.4–7.7)
Neutrophils Relative %: 64.3 % (ref 43.0–77.0)
Platelets: 449 10*3/uL — ABNORMAL HIGH (ref 150.0–400.0)
RBC: 4.71 Mil/uL (ref 3.87–5.11)
RDW: 18.5 % — ABNORMAL HIGH (ref 11.5–15.5)
WBC: 9.2 10*3/uL (ref 4.0–10.5)

## 2020-04-14 ENCOUNTER — Ambulatory Visit (HOSPITAL_COMMUNITY)
Admission: RE | Admit: 2020-04-14 | Discharge: 2020-04-14 | Disposition: A | Discharge status: Home / Self Care | Payer: Medicare Other | Source: Ambulatory Visit | Attending: Gastroenterology | Admitting: Gastroenterology

## 2020-04-14 ENCOUNTER — Other Ambulatory Visit: Payer: Self-pay

## 2020-04-14 ENCOUNTER — Encounter: Payer: Self-pay | Admitting: Gastroenterology

## 2020-04-14 DIAGNOSIS — D509 Iron deficiency anemia, unspecified: Secondary | ICD-10-CM | POA: Diagnosis not present

## 2020-04-14 DIAGNOSIS — K921 Melena: Secondary | ICD-10-CM | POA: Insufficient documentation

## 2020-04-14 LAB — IRON, TOTAL/TOTAL IRON BINDING CAP
%SAT: 10 % (calc) — ABNORMAL LOW (ref 16–45)
Iron: 38 ug/dL — ABNORMAL LOW (ref 45–160)
TIBC: 390 mcg/dL (calc) (ref 250–450)

## 2020-04-14 MED ORDER — IOHEXOL 350 MG/ML SOLN
100.0000 mL | Freq: Once | INTRAVENOUS | Status: AC | PRN
  Administered 2020-04-14: 100 mL via INTRAVENOUS

## 2020-04-15 ENCOUNTER — Ambulatory Visit (HOSPITAL_COMMUNITY): Payer: Medicare Other

## 2020-04-20 ENCOUNTER — Other Ambulatory Visit: Payer: Self-pay | Admitting: Gastroenterology

## 2020-04-20 DIAGNOSIS — D509 Iron deficiency anemia, unspecified: Secondary | ICD-10-CM

## 2020-04-21 ENCOUNTER — Telehealth: Payer: Self-pay | Admitting: Gastroenterology

## 2020-04-21 NOTE — Telephone Encounter (Signed)
Spoke to patient to inform her of recent lab results and Dr Steve Rattler recommendations. She will proceed with the Iron infusions,but declined VCE. Dr Lyndel Safe made aware of her decision.

## 2020-05-19 ENCOUNTER — Other Ambulatory Visit: Payer: Self-pay | Admitting: Family

## 2020-05-19 DIAGNOSIS — D500 Iron deficiency anemia secondary to blood loss (chronic): Secondary | ICD-10-CM

## 2020-05-19 DIAGNOSIS — K921 Melena: Secondary | ICD-10-CM

## 2020-05-20 ENCOUNTER — Other Ambulatory Visit: Payer: Self-pay

## 2020-05-20 ENCOUNTER — Encounter: Payer: Self-pay | Admitting: Family

## 2020-05-20 ENCOUNTER — Inpatient Hospital Stay: Payer: Medicare Other | Attending: Family | Admitting: Family

## 2020-05-20 ENCOUNTER — Inpatient Hospital Stay: Payer: Medicare Other

## 2020-05-20 VITALS — BP 146/67 | HR 88 | Temp 98.1°F | Wt 259.0 lb

## 2020-05-20 DIAGNOSIS — K921 Melena: Secondary | ICD-10-CM | POA: Diagnosis not present

## 2020-05-20 DIAGNOSIS — D500 Iron deficiency anemia secondary to blood loss (chronic): Secondary | ICD-10-CM | POA: Insufficient documentation

## 2020-05-20 LAB — CBC WITH DIFFERENTIAL (CANCER CENTER ONLY)
Abs Immature Granulocytes: 0.03 10*3/uL (ref 0.00–0.07)
Basophils Absolute: 0.1 10*3/uL (ref 0.0–0.1)
Basophils Relative: 1 %
Eosinophils Absolute: 0.2 10*3/uL (ref 0.0–0.5)
Eosinophils Relative: 3 %
HCT: 37.4 % (ref 36.0–46.0)
Hemoglobin: 11.8 g/dL — ABNORMAL LOW (ref 12.0–15.0)
Immature Granulocytes: 0 %
Lymphocytes Relative: 29 %
Lymphs Abs: 2.3 10*3/uL (ref 0.7–4.0)
MCH: 24.5 pg — ABNORMAL LOW (ref 26.0–34.0)
MCHC: 31.6 g/dL (ref 30.0–36.0)
MCV: 77.8 fL — ABNORMAL LOW (ref 80.0–100.0)
Monocytes Absolute: 0.7 10*3/uL (ref 0.1–1.0)
Monocytes Relative: 9 %
Neutro Abs: 4.6 10*3/uL (ref 1.7–7.7)
Neutrophils Relative %: 58 %
Platelet Count: 417 10*3/uL — ABNORMAL HIGH (ref 150–400)
RBC: 4.81 MIL/uL (ref 3.87–5.11)
RDW: 18.2 % — ABNORMAL HIGH (ref 11.5–15.5)
WBC Count: 8 10*3/uL (ref 4.0–10.5)
nRBC: 0 % (ref 0.0–0.2)

## 2020-05-20 LAB — CMP (CANCER CENTER ONLY)
ALT: 12 U/L (ref 0–44)
AST: 15 U/L (ref 15–41)
Albumin: 4 g/dL (ref 3.5–5.0)
Alkaline Phosphatase: 64 U/L (ref 38–126)
Anion gap: 8 (ref 5–15)
BUN: 14 mg/dL (ref 8–23)
CO2: 25 mmol/L (ref 22–32)
Calcium: 9.9 mg/dL (ref 8.9–10.3)
Chloride: 103 mmol/L (ref 98–111)
Creatinine: 1.08 mg/dL — ABNORMAL HIGH (ref 0.44–1.00)
GFR, Estimated: 58 mL/min — ABNORMAL LOW (ref >60)
Glucose, Bld: 169 mg/dL — ABNORMAL HIGH (ref 70–99)
Potassium: 4.2 mmol/L (ref 3.5–5.1)
Sodium: 136 mmol/L (ref 135–145)
Total Bilirubin: 0.3 mg/dL (ref 0.3–1.2)
Total Protein: 7.3 g/dL (ref 6.5–8.1)

## 2020-05-20 LAB — RETICULOCYTES
Immature Retic Fract: 10.2 % (ref 2.3–15.9)
RBC.: 4.85 MIL/uL (ref 3.87–5.11)
Retic Count, Absolute: 55.3 10*3/uL (ref 19.0–186.0)
Retic Ct Pct: 1.1 % (ref 0.4–3.1)

## 2020-05-20 LAB — IRON AND TIBC
Iron: 73 ug/dL (ref 41–142)
Saturation Ratios: 20 % — ABNORMAL LOW (ref 21–57)
TIBC: 366 ug/dL (ref 236–444)
UIBC: 293 ug/dL (ref 120–384)

## 2020-05-20 LAB — SAVE SMEAR(SSMR), FOR PROVIDER SLIDE REVIEW

## 2020-05-20 LAB — LACTATE DEHYDROGENASE: LDH: 186 U/L (ref 98–192)

## 2020-05-20 LAB — FERRITIN: Ferritin: 22 ng/mL (ref 11–307)

## 2020-05-20 NOTE — Progress Notes (Signed)
Hematology/Oncology Consultation   Name: Natasha Osborn      MRN: [id number]    Location: Room/bed info not found  Date: 05/20/2020 Time:8:53 AM   REFERRING PHYSICIAN: Jackquline Denmark, MD  REASON FOR CONSULT: Iron deficiency anemia    DIAGNOSIS: Iron deficiency anemia secondary to GI blood loss  HISTORY OF PRESENT ILLNESS: Natasha Osborn if a very pleasant 64 yo African American female with history of iron deficeincy anemia. She had blood in her stool earlier this year in August.  EGD that month was negative. With colonoscopy, she had 1 benign polyp removed, injection and ablation of single AVM, diverticulosis noted in the sigmoid colon as well as internal hemorrhoids.   She has been on oral iron BID for years without any benefit.  Iron saturation last month was 10%.  Hgb today is 11.8, MCV 77.8, platelets 417 and WBC count 8.0.  She is symptomatic with fatigued, and chewing ice.  She has not noted any more blood loss. No abnormal bruising or petechiae.  She completed her 6 months of Plavix post Carotid stent placement. She is now only on a baby aspirin daily.  She is diabetic and is currently on Metformin. Her Hgb A1c back in August was 5.1.  She had a maternal great aunt with history of anemia and colon cancer. She required frequent blood transfusions.  She had a brother that passed away from sickle cell disease. She thinks that she was tested with pregnancy and was negative.  She had 1 son who has since passed away. No history of miscarriage.  She still has her female organs in place.  No fever, chills, n/v, cough, rash, dizziness, SOB, chest pain, palpitations, abdominal pain or changes in bowel or bladder habits.  She has history of urinary retention and trouble emptying. She states that she has had to have her bladder stretched in the past. She states that she is holding off on going back to see urology as she does not want to have to be on another medication.  No swelling, tenderness,  numbness or tingling in her extremities.  No falls or syncopal episodes to report.  No history of smoking or ETOH.  She has maintained a good appetite and is staying well hydrated. Her weight is described as stable.  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   ROS: All other 10 point review of systems is negative.   PAST MEDICAL HISTORY:   Past Medical History:  Diagnosis Date   Anemia    Diabetes mellitus without complication ([place])    Family history of colonic polyps    Hypertension     ALLERGIES: Allergies  Allergen Reactions   Codeine Nausea And Vomiting   Levaquin [Levofloxacin] Nausea Only and Other (See Comments)    Joint pain      MEDICATIONS:  Current Outpatient Medications on File Prior to Visit  Medication Sig Dispense Refill   amLODipine (NORVASC) 10 MG tablet Take 2.5 mg by mouth daily.     aspirin 81 MG EC tablet Take 1 tablet (81 mg total) by mouth daily.     Biotin 5 MG CAPS Take 5 mg by mouth daily.     clopidogrel (PLAVIX) 75 MG tablet Take 1 tablet (75 mg total) by mouth daily at 10 pm.     ferrous sulfate 325 (65 FE) MG tablet Take 235 mg by mouth 2 (two) times daily.  hydrochlorothiazide (HYDRODIURIL) 25 MG tablet Take 25 mg by mouth daily.     lisinopril (ZESTRIL) 20 MG tablet Take 20 mg by mouth 2 (two) times daily.     metFORMIN (GLUCOPHAGE-XR) 500 MG 24 hr tablet Take 500-1,000 mg by mouth See admin instructions. Take 2 tablets (1000mg ) by mouth in the morning, 1 tablet (500mg ) by mouth at 6PM, and 1 tablet (500mg ) by mouth at 10PM.     TRADJENTA 5 MG TABS tablet Take 5 mg by mouth at bedtime as needed (high blood sugar).      No current facility-administered medications on file prior to visit.     PAST SURGICAL HISTORY Past Surgical History:  Procedure Laterality Date   BIOPSY  02/18/2020   Procedure: BIOPSY;  Surgeon: Natasha Bears, MD;  Location: [facility] ENDOSCOPY;   Service: Gastroenterology;;   CAROTID STENT INSERTION     COLONOSCOPY  10/27/2014   Colonic polyps status post polypectomy. Mild melanosis coli   COLONOSCOPY WITH PROPOFOL N/A 02/18/2020   Procedure: COLONOSCOPY WITH PROPOFOL;  Surgeon: Natasha Bears, MD;  Location: [place];  Service: Gastroenterology;  Laterality: N/A;   ESOPHAGOGASTRODUODENOSCOPY (EGD) WITH PROPOFOL N/A 02/15/2020   Procedure: ESOPHAGOGASTRODUODENOSCOPY (EGD) WITH PROPOFOL;  Surgeon: Natasha Banister, MD;  Location: [facility] ENDOSCOPY;  Service: Endoscopy;  Laterality: N/A;   HEMOSTASIS CLIP PLACEMENT  02/18/2020   Procedure: HEMOSTASIS CLIP PLACEMENT;  Surgeon: Natasha Bears, MD;  Location: [facility] ENDOSCOPY;  Service: Gastroenterology;;   HOT HEMOSTASIS N/A 02/18/2020   Procedure: HOT HEMOSTASIS (ARGON PLASMA COAGULATION/BICAP);  Surgeon: Natasha Bears, MD;  Location: [facility] ENDOSCOPY;  Service: Gastroenterology;  Laterality: N/A;   OVARIAN CYST REMOVAL Left    SCHLEROTHERAPY  02/18/2020   Procedure: SCHLEROTHERAPY;  Surgeon: Natasha Bears, MD;  Location: [facility] ENDOSCOPY;  Service: Gastroenterology;;    FAMILY HISTORY: Family History  Problem Relation Age of Onset   Colon polyps Mother        dx 54's   Colon cancer Neg Hx     SOCIAL HISTORY:  reports that she has never smoked. She has never used smokeless tobacco. She reports that she does not drink alcohol and does not use drugs.  PERFORMANCE STATUS: The patient's performance status is 1 - Symptomatic but completely ambulatory  PHYSICAL EXAM: Most Recent Vital Signs: There were no vitals taken for this visit. BP (!) 146/67 (BP Location: Right Arm, Patient Position: Sitting)    Pulse 88    Temp 98.1 F (36.7 C) (Oral)    Wt 259 lb (117.5 kg)    SpO2 100%    BMI 48.94 kg/m   General Appearance:    Alert, cooperative, no distress, appears stated age  Head:    Normocephalic, without obvious abnormality, atraumatic  Eyes:    PERRL, conjunctiva/corneas clear, EOM's  intact, fundi    benign, both eyes        Throat:   Lips, mucosa, and tongue normal; teeth and gums normal  Neck:   Supple, symmetrical, trachea midline, no adenopathy;    thyroid:  no enlargement/tenderness/nodules; no carotid   bruit or JVD  Back:     Symmetric, no curvature, ROM normal, no CVA tenderness  Lungs:     Clear to auscultation bilaterally, respirations unlabored  Chest Wall:    No tenderness or deformity   Heart:    Regular rate and rhythm, S1 and S2 normal, no murmur, rub   or gallop     Abdomen:     Soft,  non-tender, bowel sounds active all four quadrants,    no masses, no organomegaly        Extremities:   Extremities normal, atraumatic, no cyanosis or edema  Pulses:   2+ and symmetric all extremities  Skin:   Skin color, texture, turgor normal, no rashes or lesions  Lymph nodes:   Cervical, supraclavicular, and axillary nodes normal  Neurologic:   CNII-XII intact, normal strength, sensation and reflexes    throughout    LABORATORY DATA:  Results for orders placed or performed in visit on 05/20/20 (from the past 48 hour(s))  Save Smear (SSMR)     Status: None   Collection Time: 05/20/20  8:39 AM  Result Value Ref Range   Smear Review SMEAR STAINED AND AVAILABLE FOR REVIEW     Comment: Performed at [facility] Lab at [facility], [address] [place], [place] [address]  CBC with Differential ([place] Only)     Status: Abnormal   Collection Time: 05/20/20  8:39 AM  Result Value Ref Range   WBC Count 8.0 4.0 - 10.5 K/uL   RBC 4.81 3.87 - 5.11 MIL/uL   Hemoglobin 11.8 (L) 12.0 - 15.0 g/dL   HCT 37.4 36 - 46 %   MCV 77.8 (L) 80.0 - 100.0 fL   MCH 24.5 (L) 26.0 - 34.0 pg   MCHC 31.6 30.0 - 36.0 g/dL   RDW 18.2 (H) 11.5 - 15.5 %   Platelet Count 417 (H) 150 - 400 K/uL   nRBC 0.0 0.0 - 0.2 %   Neutrophils Relative % 58 %   Neutro Abs 4.6 1.7 - 7.7 K/uL   Lymphocytes Relative 29 %   Lymphs Abs 2.3 0.7 - 4.0 K/uL   Monocytes  Relative 9 %   Monocytes Absolute 0.7 0.1 - 1.0 K/uL   Eosinophils Relative 3 %   Eosinophils Absolute 0.2 0.0 - 0.5 K/uL   Basophils Relative 1 %   Basophils Absolute 0.1 0.0 - 0.1 K/uL   Immature Granulocytes 0 %   Abs Immature Granulocytes 0.03 0.00 - 0.07 K/uL    Comment: Performed at [facility] Lab at [facility], [address] [place], [place] [address]  Reticulocytes     Status: None   Collection Time: 05/20/20  8:40 AM  Result Value Ref Range   Retic Ct Pct 1.1 0.4 - 3.1 %   RBC. 4.85 3.87 - 5.11 MIL/uL   Retic Count, Absolute 55.3 19.0 - 186.0 K/uL   Immature Retic Fract 10.2 2.3 - 15.9 %    Comment: Performed at [facility] Lab at [facility], [address] [place], [place] [address]      RADIOGRAPHY: No results found.     PATHOLOGY: None  ASSESSMENT/PLAN: Ms. Fennell if a very pleasant 64 yo African American female with history of iron deficeincy anemia despite being on oral iron.  We will see what her anemia work up reveals and get her set up for replacement.  We will go ahead and get her scheduled for IV iron tomorrow as well as follow-up in 6 weeks.   All questions were answered and she is in agreement with the plan. She was encouraged to contact our office with any questions or concerns. We can certainly see her sooner if needed.   Laverna Peace, NP

## 2020-05-21 ENCOUNTER — Inpatient Hospital Stay: Payer: Medicare Other

## 2020-05-21 VITALS — BP 129/56 | HR 79 | Temp 98.6°F | Resp 18

## 2020-05-21 DIAGNOSIS — D500 Iron deficiency anemia secondary to blood loss (chronic): Secondary | ICD-10-CM

## 2020-05-21 DIAGNOSIS — D649 Anemia, unspecified: Secondary | ICD-10-CM

## 2020-05-21 DIAGNOSIS — K921 Melena: Secondary | ICD-10-CM

## 2020-05-21 LAB — ERYTHROPOIETIN: Erythropoietin: 9.7 m[IU]/mL (ref 2.6–18.5)

## 2020-05-21 MED ORDER — SODIUM CHLORIDE 0.9 % IV SOLN
200.0000 mg | Freq: Once | INTRAVENOUS | Status: AC
  Administered 2020-05-21: 200 mg via INTRAVENOUS
  Filled 2020-05-21: qty 200

## 2020-05-21 MED ORDER — SODIUM CHLORIDE 0.9 % IV SOLN
Freq: Once | INTRAVENOUS | Status: AC
  Filled 2020-05-21: qty 250

## 2020-05-21 NOTE — Patient Instructions (Signed)

## 2020-05-21 NOTE — Progress Notes (Signed)
Pt discharged in no apparent distress. Pt left ambulatory with/without assistance. Pt aware of discharge instructions andverbalized understanding and had no further questions. 

## 2020-05-22 LAB — HGB FRACTIONATION CASCADE
Hgb A2: 2.2 % (ref 1.8–3.2)
Hgb A: 97.8 % (ref 96.4–98.8)
Hgb F: 0 % (ref 0.0–2.0)
Hgb S: 0 %

## 2020-05-26 ENCOUNTER — Telehealth: Payer: Self-pay

## 2020-05-26 NOTE — Telephone Encounter (Signed)
Called pt per 05/26/20 inbasket message and she is aware of her tow iv iron apptss made    AOM

## 2020-06-02 ENCOUNTER — Other Ambulatory Visit: Payer: Self-pay

## 2020-06-02 ENCOUNTER — Inpatient Hospital Stay: Payer: Medicare Other

## 2020-06-02 VITALS — BP 129/48 | HR 78 | Temp 98.7°F | Resp 18

## 2020-06-02 DIAGNOSIS — D649 Anemia, unspecified: Secondary | ICD-10-CM

## 2020-06-02 DIAGNOSIS — D500 Iron deficiency anemia secondary to blood loss (chronic): Secondary | ICD-10-CM

## 2020-06-02 DIAGNOSIS — K921 Melena: Secondary | ICD-10-CM

## 2020-06-02 MED ORDER — SODIUM CHLORIDE 0.9 % IV SOLN
Freq: Once | INTRAVENOUS | Status: AC
  Filled 2020-06-02: qty 250

## 2020-06-02 MED ORDER — SODIUM CHLORIDE 0.9 % IV SOLN
200.0000 mg | Freq: Once | INTRAVENOUS | Status: AC
  Administered 2020-06-02: 200 mg via INTRAVENOUS
  Filled 2020-06-02: qty 200

## 2020-06-02 NOTE — Patient Instructions (Addendum)

## 2020-06-02 NOTE — Progress Notes (Signed)
Pt discharged in no apparent distress. Pt left ambulatory without assistance. Pt aware of discharge instructions and verbalized understanding and had no further questions.  

## 2020-06-08 ENCOUNTER — Other Ambulatory Visit: Payer: Self-pay

## 2020-06-08 ENCOUNTER — Inpatient Hospital Stay: Payer: Medicare Other

## 2020-06-08 VITALS — BP 142/61 | HR 87 | Temp 98.4°F | Resp 16

## 2020-06-08 DIAGNOSIS — K921 Melena: Secondary | ICD-10-CM

## 2020-06-08 DIAGNOSIS — D649 Anemia, unspecified: Secondary | ICD-10-CM

## 2020-06-08 DIAGNOSIS — D500 Iron deficiency anemia secondary to blood loss (chronic): Secondary | ICD-10-CM | POA: Diagnosis not present

## 2020-06-08 MED ORDER — SODIUM CHLORIDE 0.9 % IV SOLN
200.0000 mg | Freq: Once | INTRAVENOUS | Status: AC
  Administered 2020-06-08: 200 mg via INTRAVENOUS
  Filled 2020-06-08: qty 200

## 2020-06-08 MED ORDER — SODIUM CHLORIDE 0.9 % IV SOLN
Freq: Once | INTRAVENOUS | Status: AC
  Filled 2020-06-08: qty 250

## 2020-06-08 NOTE — Patient Instructions (Addendum)

## 2020-07-01 ENCOUNTER — Other Ambulatory Visit: Payer: Self-pay

## 2020-07-01 ENCOUNTER — Inpatient Hospital Stay (HOSPITAL_BASED_OUTPATIENT_CLINIC_OR_DEPARTMENT_OTHER): Payer: Medicare Other | Admitting: Family

## 2020-07-01 ENCOUNTER — Inpatient Hospital Stay: Payer: Medicare Other | Attending: Family

## 2020-07-01 ENCOUNTER — Encounter: Payer: Self-pay | Admitting: Family

## 2020-07-01 ENCOUNTER — Telehealth: Payer: Self-pay | Admitting: Family

## 2020-07-01 VITALS — BP 142/65 | HR 88 | Temp 97.5°F | Resp 18 | Ht 61.0 in | Wt 252.1 lb

## 2020-07-01 DIAGNOSIS — K921 Melena: Secondary | ICD-10-CM

## 2020-07-01 DIAGNOSIS — D649 Anemia, unspecified: Secondary | ICD-10-CM

## 2020-07-01 DIAGNOSIS — D500 Iron deficiency anemia secondary to blood loss (chronic): Secondary | ICD-10-CM | POA: Insufficient documentation

## 2020-07-01 LAB — CMP (CANCER CENTER ONLY)
ALT: 19 U/L (ref 0–44)
AST: 19 U/L (ref 15–41)
Albumin: 4.4 g/dL (ref 3.5–5.0)
Alkaline Phosphatase: 74 U/L (ref 38–126)
Anion gap: 9 (ref 5–15)
BUN: 16 mg/dL (ref 8–23)
CO2: 28 mmol/L (ref 22–32)
Calcium: 10.3 mg/dL (ref 8.9–10.3)
Chloride: 100 mmol/L (ref 98–111)
Creatinine: 0.87 mg/dL (ref 0.44–1.00)
GFR, Estimated: >60 mL/min (ref >60)
Glucose, Bld: 160 mg/dL — ABNORMAL HIGH (ref 70–99)
Potassium: 4 mmol/L (ref 3.5–5.1)
Sodium: 137 mmol/L (ref 135–145)
Total Bilirubin: 0.8 mg/dL (ref 0.3–1.2)
Total Protein: 8 g/dL (ref 6.5–8.1)

## 2020-07-01 LAB — IRON AND TIBC
Iron: 108 ug/dL (ref 41–142)
Saturation Ratios: 34 % (ref 21–57)
TIBC: 320 ug/dL (ref 236–444)
UIBC: 212 ug/dL (ref 120–384)

## 2020-07-01 LAB — RETICULOCYTES
Immature Retic Fract: 6.3 % (ref 2.3–15.9)
RBC.: 4.97 MIL/uL (ref 3.87–5.11)
Retic Count, Absolute: 91.4 10*3/uL (ref 19.0–186.0)
Retic Ct Pct: 1.8 % (ref 0.4–3.1)

## 2020-07-01 LAB — CBC WITH DIFFERENTIAL (CANCER CENTER ONLY)
Abs Immature Granulocytes: 0.03 10*3/uL (ref 0.00–0.07)
Basophils Absolute: 0.1 10*3/uL (ref 0.0–0.1)
Basophils Relative: 1 %
Eosinophils Absolute: 0.2 10*3/uL (ref 0.0–0.5)
Eosinophils Relative: 2 %
HCT: 41.1 % (ref 36.0–46.0)
Hemoglobin: 13.1 g/dL (ref 12.0–15.0)
Immature Granulocytes: 0 %
Lymphocytes Relative: 32 %
Lymphs Abs: 2.4 10*3/uL (ref 0.7–4.0)
MCH: 26.3 pg (ref 26.0–34.0)
MCHC: 31.9 g/dL (ref 30.0–36.0)
MCV: 82.4 fL (ref 80.0–100.0)
Monocytes Absolute: 0.6 10*3/uL (ref 0.1–1.0)
Monocytes Relative: 7 %
Neutro Abs: 4.4 10*3/uL (ref 1.7–7.7)
Neutrophils Relative %: 58 %
Platelet Count: 394 10*3/uL (ref 150–400)
RBC: 4.99 MIL/uL (ref 3.87–5.11)
RDW: 16.7 % — ABNORMAL HIGH (ref 11.5–15.5)
WBC Count: 7.6 10*3/uL (ref 4.0–10.5)
nRBC: 0 % (ref 0.0–0.2)

## 2020-07-01 LAB — FERRITIN: Ferritin: 182 ng/mL (ref 11–307)

## 2020-07-01 NOTE — Progress Notes (Signed)
Hematology and Oncology Follow Up Visit  Natasha Osborn [id number] 1955-11-05 64 y.o. 07/01/2020   Principle Diagnosis:  Iron deficiency anemia secondary to intermittent GI blood loss  Current Therapy:   IV iron as indicated    Interim History:  Natasha Osborn is here today with her husband for follow-up. She is doing quite well and feeling much better since receiving IV iron. She states that her symptoms have resolved.  No fatigue at this time.  She has not noted any blood loss. No abnormal bruising, no petechiae.  No fever, chills, n/v, cough, rash, dizziness, SOB, chest pain, palpitations, abdominal pain or changes in bowel or bladder habits.  No swelling, tenderness, numbness or tingling in her extremities.  No falls or syncope.  Her appetite comes and goes. She is still eating well and staying hydrated. Her weight is stable at 252 lbs.   ECOG Performance Status: 0 - Asymptomatic  Medications:  Allergies as of 07/01/2020      Reactions   Codeine Nausea And Vomiting   Levaquin [levofloxacin] Nausea Only, Other (See Comments)   Joint pain      Medication List       Accurate as of July 01, 2020 11:13 AM. If you have any questions, ask your nurse or doctor.        STOP taking these medications   Tradjenta 5 MG Tabs tablet Generic drug: linagliptin Stopped by: Natasha Peace, NP     TAKE these medications   amLODipine 10 MG tablet Commonly known as: NORVASC Take 2.5 mg by mouth daily.   aspirin 81 MG EC tablet Take 1 tablet (81 mg total) by mouth daily.   Biotin 5 MG Caps Take 5 mg by mouth daily.   ferrous sulfate 325 (65 FE) MG tablet Take 235 mg by mouth 2 (two) times daily.   hydrochlorothiazide 25 MG tablet Commonly known as: HYDRODIURIL Take 25 mg by mouth daily.   lisinopril 20 MG tablet Commonly known as: ZESTRIL Take 20 mg by mouth 2 (two) times daily.   metFORMIN 500 MG 24 hr tablet Commonly known as: GLUCOPHAGE-XR Take 500-1,000 mg  by mouth See admin instructions. Take 2 tablets (1000mg ) by mouth in the morning, 1 tablet (500mg ) by mouth at 6PM, and 1 tablet (500mg ) by mouth at 10PM.       Allergies:  Allergies  Allergen Reactions  . Codeine Nausea And Vomiting  . Levaquin [Levofloxacin] Nausea Only and Other (See Comments)    Joint pain    Past Medical History, Surgical history, Social history, and Family History were reviewed and updated.  Review of Systems: All other 10 point review of systems is negative.   Physical Exam:  height is 5\' 1"  (1.549 m) and weight is 252 lb 1.9 oz (114.4 kg). Her oral temperature is 97.5 F (36.4 C) (abnormal). Her blood pressure is 142/65 (abnormal) and her pulse is 88. Her respiration is 18 and oxygen saturation is 100%.   Wt Readings from Last 3 Encounters:  07/01/20 252 lb 1.9 oz (114.4 kg)  05/20/20 259 lb (117.5 kg)  03/24/20 256 lb 4 oz (116.2 kg)    Ocular: Sclerae unicteric, pupils equal, round and reactive to light Ear-nose-throat: Oropharynx clear, dentition fair Lymphatic: No cervical or supraclavicular adenopathy Lungs no rales or rhonchi, good excursion bilaterally Heart regular rate and rhythm, no murmur appreciated Abd soft, nontender, positive bowel sounds MSK no focal spinal tenderness, no joint edema Neuro: non-focal, well-oriented, appropriate affect Breasts: Deferred  Lab Results  Component Value Date   WBC 7.6 07/01/2020   HGB 13.1 07/01/2020   HCT 41.1 07/01/2020   MCV 82.4 07/01/2020   PLT 394 07/01/2020   Lab Results  Component Value Date   FERRITIN 22 05/20/2020   IRON 73 05/20/2020   TIBC 366 05/20/2020   UIBC 293 05/20/2020   IRONPCTSAT 20 (L) 05/20/2020   Lab Results  Component Value Date   RETICCTPCT 1.8 07/01/2020   RBC 4.99 07/01/2020   RBC 4.97 07/01/2020   No results found for: KPAFRELGTCHN, LAMBDASER, KAPLAMBRATIO No results found for: IGGSERUM, IGA, IGMSERUM No results found for: Odetta Pink, SPEI   Chemistry      Component Value Date/Time   NA 137 07/01/2020 1021   K 4.0 07/01/2020 1021   CL 100 07/01/2020 1021   CO2 28 07/01/2020 1021   BUN 16 07/01/2020 1021   CREATININE 0.87 07/01/2020 1021      Component Value Date/Time   CALCIUM 10.3 07/01/2020 1021   ALKPHOS 74 07/01/2020 1021   AST 19 07/01/2020 1021   ALT 19 07/01/2020 1021   BILITOT 0.8 07/01/2020 1021       Impression and Plan: Natasha Osborn if a very pleasant 64 yo African American female with history of iron deficeincy anemia secondary to intermittent GI blood loss.  Iron studies are pending. We will replace if needed.  Follow-up in 2 months.  She can contact our office with any questions or concerns.   Natasha Peace, NP 12/22/202111:13 AM

## 2020-07-01 NOTE — Telephone Encounter (Signed)
Called and LMVM for appointments that have been scheduled per 12/22 los

## 2020-09-01 ENCOUNTER — Inpatient Hospital Stay: Payer: Medicare Other

## 2020-09-01 ENCOUNTER — Inpatient Hospital Stay: Payer: Medicare Other | Admitting: Family

## 2020-09-24 ENCOUNTER — Telehealth: Payer: Self-pay | Admitting: Oncology

## 2020-09-24 NOTE — Telephone Encounter (Signed)
3/15 Patient Declined an Appt here

## 2020-10-10 ENCOUNTER — Other Ambulatory Visit: Payer: Self-pay

## 2020-10-10 DIAGNOSIS — I6521 Occlusion and stenosis of right carotid artery: Secondary | ICD-10-CM

## 2020-10-27 ENCOUNTER — Other Ambulatory Visit: Payer: Self-pay

## 2020-10-27 ENCOUNTER — Ambulatory Visit: Payer: Medicare Other | Admitting: Vascular Surgery

## 2020-10-27 ENCOUNTER — Encounter: Payer: Self-pay | Admitting: Vascular Surgery

## 2020-10-27 ENCOUNTER — Ambulatory Visit (HOSPITAL_COMMUNITY)
Admission: RE | Admit: 2020-10-27 | Discharge: 2020-10-27 | Disposition: A | Discharge status: Home / Self Care | Payer: Medicare Other | Source: Ambulatory Visit | Attending: Vascular Surgery | Admitting: Vascular Surgery

## 2020-10-27 VITALS — BP 163/77 | HR 85 | Temp 97.7°F | Resp 16 | Ht 61.0 in | Wt 253.0 lb

## 2020-10-27 DIAGNOSIS — I6523 Occlusion and stenosis of bilateral carotid arteries: Secondary | ICD-10-CM

## 2020-10-27 DIAGNOSIS — I6521 Occlusion and stenosis of right carotid artery: Secondary | ICD-10-CM | POA: Insufficient documentation

## 2020-10-27 NOTE — Progress Notes (Signed)
[facility] [place]  ASSESSMENT / PLAN: 65 y.o. female status post right carotid artery stenting (8x40mm Cordis Precise) at [facility] for severe asymptomatic, surgically inaccessible lesion. Continue ASA. Recommend statin therapy. Continue surveillance with duplex ultrasound. Follow up with me in 1 year.   CHIEF COMPLAINT: carotid stent surveillance  HISTORY OF PRESENT ILLNESS: Natasha Osborn is a 65 y.o. female who underwent rate transradial carotid angioplasty and stenting on 10/17/2019 by Dr. Bridgette Habermann of [facility] for asymptomatic severe right carotid artery stenosis at the level of C2.  She did very well from the procedure and has had no neurologic symptoms since.  She has been compliant with aspirin.  She took Plavix for 6 months post procedure and is since discontinued.  She is not taking statin therapy.  She prefers to transfer her care here because the long drive from [place] to [place].  Past Medical History:  Diagnosis Date  . Anemia   . Diabetes mellitus without complication ([place])   . Family history of colonic polyps   . Hypertension     Past Surgical History:  Procedure Laterality Date  . BIOPSY  02/18/2020   Procedure: BIOPSY;  Surgeon: Jerene Bears, MD;  Location: [place];  Service: Gastroenterology;;  . CAROTID STENT INSERTION    . COLONOSCOPY  10/27/2014   Colonic polyps status post polypectomy. Mild melanosis coli  . COLONOSCOPY WITH PROPOFOL N/A 02/18/2020   Procedure: COLONOSCOPY WITH PROPOFOL;  Surgeon: Jerene Bears, MD;  Location: [place];  Service: Gastroenterology;  Laterality: N/A;  . ESOPHAGOGASTRODUODENOSCOPY (EGD) WITH PROPOFOL N/A 02/15/2020   Procedure: ESOPHAGOGASTRODUODENOSCOPY (EGD) WITH PROPOFOL;  Surgeon: Milus Banister, MD;  Location: [facility] ENDOSCOPY;  Service: Endoscopy;  Laterality: N/A;  . HEMOSTASIS CLIP PLACEMENT  02/18/2020   Procedure: HEMOSTASIS CLIP PLACEMENT;  Surgeon: Jerene Bears, MD;  Location:  [place] ENDOSCOPY;  Service: Gastroenterology;;  . HOT HEMOSTASIS N/A 02/18/2020   Procedure: HOT HEMOSTASIS (ARGON PLASMA COAGULATION/BICAP);  Surgeon: Jerene Bears, MD;  Location: [facility] ENDOSCOPY;  Service: Gastroenterology;  Laterality: N/A;  . OVARIAN CYST REMOVAL Left   . SCHLEROTHERAPY  02/18/2020   Procedure: [place] Ku;  Surgeon: Jerene Bears, MD;  Location: [facility] ENDOSCOPY;  Service: Gastroenterology;;    Family History  Problem Relation Age of Onset  . Colon polyps Mother        dx 2's  . Colon cancer Neg Hx     Social History   Socioeconomic History  . Marital status: Married    Spouse name: Not on file  . Number of children: 1  . Years of education: Not on file  . Highest education level: Not on file  Occupational History  . Occupation: disabled  Tobacco Use  . Smoking status: Never Smoker  . Smokeless tobacco: Never Used  Vaping Use  . Vaping Use: Never used  Substance and Sexual Activity  . Alcohol use: Never  . Drug use: Never  . Sexual activity: Not on file  Other Topics Concern  . Not on file  Social History Narrative  . Not on file   Social Determinants of Health   Financial Resource Strain: Not on file  Food Insecurity: Not on file  Transportation Needs: Not on file  Physical Activity: Not on file  Stress: Not on file  Social Connections: Not on file  Intimate Partner Violence: Not on file    Allergies  Allergen Reactions  . Codeine Nausea And Vomiting  . Levaquin [Levofloxacin] Nausea  Only and Other (See Comments)    Joint pain    Current Outpatient Medications  Medication Sig Dispense Refill  . amLODipine (NORVASC) 10 MG tablet Take 2.5 mg by mouth daily.    Marland Kitchen aspirin 81 MG EC tablet Take 1 tablet (81 mg total) by mouth daily.    . Biotin 5 MG CAPS Take 5 mg by mouth daily.    . ferrous sulfate 325 (65 FE) MG tablet Take 235 mg by mouth 2 (two) times daily.     . hydrochlorothiazide (HYDRODIURIL) 25 MG tablet Take 25 mg by mouth daily.     Marland Kitchen lisinopril (ZESTRIL) 20 MG tablet Take 20 mg by mouth 2 (two) times daily.    . metFORMIN (GLUCOPHAGE-XR) 500 MG 24 hr tablet Take 500-1,000 mg by mouth See admin instructions. Take 2 tablets (1000mg ) by mouth in the morning, 1 tablet (500mg ) by mouth at 6PM, and 1 tablet (500mg ) by mouth at 10PM.     No current facility-administered medications for this visit.    REVIEW OF SYSTEMS:  [X]  denotes positive finding, [ ]  denotes negative finding Cardiac  Comments:  Chest pain or chest pressure:    Shortness of breath upon exertion:    Short of breath when lying flat:    Irregular heart rhythm:        Vascular    Pain in calf, thigh, or hip brought on by ambulation:    Pain in feet at night that wakes you up from your sleep:     Blood clot in your veins:    Leg swelling:         Pulmonary    Oxygen at home:    Productive cough:     Wheezing:         Neurologic    Sudden weakness in arms or legs:     Sudden numbness in arms or legs:     Sudden onset of difficulty speaking or slurred speech:    Temporary loss of vision in one eye:     Problems with dizziness:         Gastrointestinal    Blood in stool:     Vomited blood:         Genitourinary    Burning when urinating:     Blood in urine:        Psychiatric    Major depression:         Hematologic    Bleeding problems:    Problems with blood clotting too easily:        Skin    Rashes or ulcers:        Constitutional    Fever or chills:      PHYSICAL EXAM  Vitals:   10/27/20 1124 10/27/20 1126  BP: 126/80 (!) 163/77  Pulse: 84 85  Resp: 16   Temp: 97.7 F (36.5 C)   TempSrc: Temporal   SpO2: 97%   Weight: 253 lb (114.8 kg)   Height: 5\' 1"  (1.549 m)     Constitutional: well appearing. no distress. Appears well nourished.  Neurologic: CN intact. no focal findings. no sensory loss. Psychiatric: Mood and affect symmetric and appropriate. Eyes: No icterus. No conjunctival pallor. Ears, nose, throat:  mucous membranes moist. Midline trachea.  Cardiac: regular rate and rhythm.  Respiratory: unlabored. Abdominal: soft, non-tender, non-distended.  Peripheral vascular:  2+ R radial pulse Extremity: No edema. No cyanosis. No pallor.  Skin: No gangrene. No ulceration.  Lymphatic: No Stemmer's  sign. No palpable lymphadenopathy.  PERTINENT LABORATORY AND RADIOLOGIC DATA  Most recent CBC CBC Latest Ref Rng & Units 07/01/2020 05/20/2020 04/13/2020  WBC 4.0 - 10.5 K/uL 7.6 8.0 9.2  Hemoglobin 12.0 - 15.0 g/dL 13.1 11.8(L) 11.2(L)  Hematocrit 36.0 - 46.0 % 41.1 37.4 35.5(L)  Platelets 150 - 400 K/uL 394 417(H) 449.0(H)     Most recent CMP CMP Latest Ref Rng & Units 07/01/2020 05/20/2020 04/13/2020  Glucose 70 - 99 mg/dL 160(H) 169(H) 147(H)  BUN 8 - 23 mg/dL 16 14 12   Creatinine 0.44 - 1.00 mg/dL 0.87 1.08(H) 0.87  Sodium 135 - 145 mmol/L 137 136 136  Potassium 3.5 - 5.1 mmol/L 4.0 4.2 4.3  Chloride 98 - 111 mmol/L 100 103 102  CO2 22 - 32 mmol/L 28 25 26   Calcium 8.9 - 10.3 mg/dL 10.3 9.9 9.3  Total Protein 6.5 - 8.1 g/dL 8.0 7.3 7.5  Total Bilirubin 0.3 - 1.2 mg/dL 0.8 0.3 0.3  Alkaline Phos 38 - 126 U/L 74 64 71  AST 15 - 41 U/L 19 15 15   ALT 0 - 44 U/L 19 12 16     Renal function CrCl cannot be calculated (Patient's most recent lab result is older than the maximum 21 days allowed.).  Hgb A1c MFr Bld (%)  Date Value  02/14/2020 5.1    No results found for: LDLCALC, LDLC, HIRISKLDL, POCLDL, LDLDIRECT, REALLDLC, TOTLDLC   Vascular Imaging:   Yevonne Aline. Stanford Breed, MD [facility] Phone Number: [phone] 10/27/2020 1:03 PM

## 2021-11-15 ENCOUNTER — Other Ambulatory Visit: Payer: Self-pay

## 2021-11-15 DIAGNOSIS — I6523 Occlusion and stenosis of bilateral carotid arteries: Secondary | ICD-10-CM

## 2021-11-29 NOTE — Progress Notes (Unsigned)
[facility] [place]  ASSESSMENT / PLAN: 66 y.o. female status post right carotid [facility] Continue ASA. Recommend statin therapy. Continue surveillance with duplex ultrasound. Follow up with me in 2 years.   CHIEF COMPLAINT: carotid stent surveillance  HISTORY OF PRESENT ILLNESS: Natasha MAGILLis a 66y.o. female who underwent rate transradial carotid angioplasty and stenting on 10/17/2019 by Dr. [facility]ridgette [facility]of [facility] right [facility] artery stenosis at the level of C2.  She did very well from the procedure and has had no neurologic symptoms since.  She has been compliant with aspirin.  She took Plavix for 6 months post procedure and is since discontinued.  She is not taking statin therapy.  [place]  11/30/21: Patient returns for yearly surveillance.  She is doing well overall.  She reports no symptoms attributable to carotid artery stenosis.  She denies any unilateral weakness, numbness, visual changes, facial droop.  Past Medical History:  Diagnosis Date   Anemia    Carotid artery occlusion    Diabetes mellitus without complication (HCC)    Family history of colonic polyps    Hypertension     Past Surgical History:  Procedure Laterality Date   BIOPSY  02/18/2020   Procedure: BIOPSY;  Surgeon: PJerene Bears MD;  [facility]  Service: Gastroenterology;;   CAROTID STENT INSERTION     COLONOSCOPY  10/27/2014   Colonic polyps status post polypectomy. Mild melanosis coli   COLONOSCOPY WITH PROPOFOL N/A 02/18/2020   Procedure: COLONOSCOPY WITH PROPOFOL;  Surgeon: PJerene Bears MD;  [place]  Service: Gastroenterology;  Laterality: N/A;   ESOPHAGOGASTRODUODENOSCOPY (EGD) WITH PROPOFOL N/A 02/15/2020   Procedure:  ESOPHAGOGASTRODUODENOSCOPY (EGD) WITH PROPOFOL;  Surgeon: JMilus Banister MD;  [facility];  Service: Endoscopy;  Laterality: N/A;   HEMOSTASIS CLIP PLACEMENT  02/18/2020   Procedure: HEMOSTASIS CLIP PLACEMENT;  Surgeon: PJerene Bears MD;  [place];  Service: Gastroenterology;;   HOT HEMOSTASIS N/A 02/18/2020   Procedure: HOT HEMOSTASIS (ARGON PLASMA COAGULATION/BICAP);  Surgeon: PJerene Bears MD;  [facility];  Service: Gastroenterology;  Laterality: N/A;   OVARIAN CYST REMOVAL Left    SCHLEROTHERAPY  02/18/2020   Procedure: SCHLEROTHERAPY;  Surgeon: PJerene Bears MD;  [facility]  Service: Gastroenterology;;    Family History  Problem Relation Age of Onset   Colon polyps Mother        dx 654's  Colon cancer Neg Hx     Social History   Socioeconomic History   Marital status: Married    Spouse name: Not on file   Number of children: 1   Years of education: Not on file   Highest education level: Not on file  Occupational History   Occupation: disabled  Tobacco Use   Smoking status: Never   Smokeless tobacco: Never  Vaping Use   Vaping Use: Never used  Substance and Sexual Activity   Alcohol use: Never   Drug use: Never   Sexual activity: Not on file  Other Topics Concern   Not on file  Social History Narrative   Not on file   Social Determinants of Health   Financial Resource Strain: Not on file  Food Insecurity: Not on file  Transportation Needs: Not on file  Physical Activity: Not  on file  Stress: Not on file  Social Connections: Not on file  Intimate Partner Violence: Not on file    Allergies  Allergen Reactions   Codeine Nausea And Vomiting   Levaquin [Levofloxacin] Nausea Only and Other (See Comments)    Joint pain    Current Outpatient Medications  Medication Sig Dispense Refill   amLODipine (NORVASC) 10 MG tablet Take 2.5 mg by mouth daily.     aspirin 81 MG EC tablet Take 1 tablet (81 mg total) by  mouth daily.     Biotin 5 MG CAPS Take 5 mg by mouth daily.     ferrous sulfate 325 (65 FE) MG tablet Take 235 mg by mouth 2 (two) times daily.      hydrochlorothiazide (HYDRODIURIL) 25 MG tablet Take 25 mg by mouth daily.     lisinopril (ZESTRIL) 20 MG tablet Take 20 mg by mouth 2 (two) times daily.     metFORMIN (GLUCOPHAGE-XR) 500 MG 24 hr tablet Take 500-1,000 mg by mouth See admin instructions. Take 2 tablets ('1000mg'$ ) by mouth in the morning, 1 tablet ('500mg'$ ) by mouth at 6PM, and 1 tablet ('500mg'$ ) by mouth at 10PM.     No current facility-administered medications for this visit.    PHYSICAL EXAM  Vitals:   11/30/21 1526  BP: 134/78  Pulse: 83  Resp: 20  Temp: 98.3 F (36.8 C)  SpO2: 100%  Weight: 238 lb (108 kg)  Height: '5\' 1"'$  (1.549 m)     Constitutional: well appearing. no distress. Appears well nourished.  Neurologic: CN intact. no focal findings. no sensory loss. Psychiatric: Mood and affect symmetric and appropriate. Eyes: No icterus. No conjunctival pallor. Ears, nose, throat: mucous membranes moist. Midline trachea.  Cardiac: regular rate and rhythm.  Respiratory: unlabored. Abdominal: soft, non-tender, non-distended.  Peripheral vascular:  2+ R radial pulse Extremity: No edema. No cyanosis. No pallor.  Skin: No gangrene. No ulceration.  Lymphatic: No Stemmer's sign. No palpable lymphadenopathy.  PERTINENT LABORATORY AND RADIOLOGIC DATA  Most recent CBC    Latest Ref Rng & Units 07/01/2020   10:21 AM 05/20/2020    8:39 AM 04/13/2020   11:33 AM  CBC  WBC 4.0 - 10.5 K/uL 7.6   8.0   9.2    Hemoglobin 12.0 - 15.0 g/dL 13.1   11.8   11.2    Hematocrit 36.0 - 46.0 % 41.1   37.4   35.5    Platelets 150 - 400 K/uL 394   417   449.0       Most recent CMP    Latest Ref Rng & Units 07/01/2020   10:21 AM 05/20/2020    8:39 AM 04/13/2020   11:33 AM  CMP  Glucose 70 - 99 mg/dL 160   169   147    BUN 8 - 23 mg/dL '16   14   12    '$ Creatinine 0.44 - 1.00 mg/dL  0.87   1.08   0.87    Sodium 135 - 145 mmol/L 137   136   136    Potassium 3.5 - 5.1 mmol/L 4.0   4.2   4.3    Chloride 98 - 111 mmol/L 100   103   102    CO2 22 - 32 mmol/L '28   25   26    '$ Calcium 8.9 - 10.3 mg/dL 10.3   9.9   9.3    Total Protein 6.5 - 8.1 g/dL 8.0   7.3  7.5    Total Bilirubin 0.3 - 1.2 mg/dL 0.8   0.3   0.3    Alkaline Phos 38 - 126 U/L 74   64   71    AST 15 - 41 U/L '19   15   15    '$ ALT 0 - 44 U/L '19   12   16      '$ Renal function CrCl cannot be calculated (Patient's most recent lab result is older than the maximum 21 days allowed.).  Hgb A1c MFr Bld (%)  Date Value  02/14/2020 5.1    No results found for: LDLCALC, LDLC, HIRISKLDL, POCLDL, LDLDIRECT, REALLDLC, TOTLDLC   Vascular Imaging: Carotid duplex 11/30/21  Right Carotid: The ECA appears >50% stenosed. Patent stent.   Left Carotid: Velocities in the left ICA are consistent with a 1-39%  stenosis.                The ECA appears >50% stenosed.   Vertebrals:  Bilateral vertebral arteries demonstrate antegrade flow.  Subclavians: Normal flow hemodynamics were seen in bilateral subclavian               arteries.   Natasha Osborn. Stanford Breed, MD [facility] Phone Number: [phone] 11/30/2021 5:30 PM

## 2021-11-30 ENCOUNTER — Ambulatory Visit (INDEPENDENT_AMBULATORY_CARE_PROVIDER_SITE_OTHER): Payer: Medicare Other | Admitting: Vascular Surgery

## 2021-11-30 ENCOUNTER — Encounter: Payer: Self-pay | Admitting: Vascular Surgery

## 2021-11-30 ENCOUNTER — Ambulatory Visit (HOSPITAL_COMMUNITY)
Admission: RE | Admit: 2021-11-30 | Discharge: 2021-11-30 | Disposition: A | Discharge status: Home / Self Care | Payer: Medicare Other | Source: Ambulatory Visit | Attending: Vascular Surgery | Admitting: Vascular Surgery

## 2021-11-30 VITALS — BP 134/78 | HR 83 | Temp 98.3°F | Resp 20 | Ht 61.0 in | Wt 238.0 lb

## 2021-11-30 DIAGNOSIS — I6523 Occlusion and stenosis of bilateral carotid arteries: Secondary | ICD-10-CM

## 2022-03-29 IMAGING — CT CT CTA ABD/PEL W/CM AND/OR W/O CM
3 of 13 series · 11 of 46 positions shown, 17 images · IV contrast (APPLIED)
Comparison: CT dated 10/29/2009

CLINICAL DATA: Anemia.  Melena.

EXAM:
CTA ABDOMEN AND PELVIS WITHOUT AND WITH CONTRAST
TECHNIQUE: Multidetector CT imaging of the abdomen and pelvis was performed
using the standard protocol during bolus administration of
intravenous contrast. Multiplanar reconstructed images and MIPs were
obtained and reviewed to evaluate the vascular anatomy.
CONTRAST:  100mL OMNIPAQUE IOHEXOL 350 MG/ML SOLN

[Series 5: axial arterial · axial · arterial · 0.98mm/px · z∈[+1064,+1360]mm · 6 of 241 slices shown]
[im 19/241  soft-tissue]
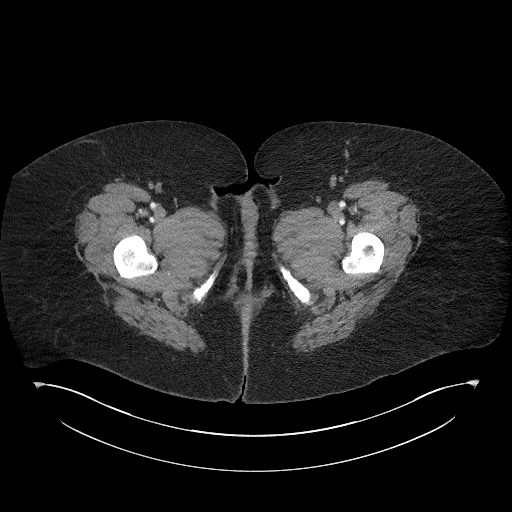
[im 56/241  soft-tissue]
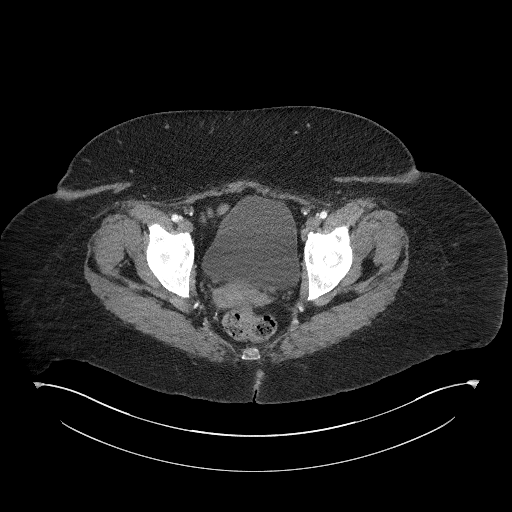
[im 74/241  soft-tissue]
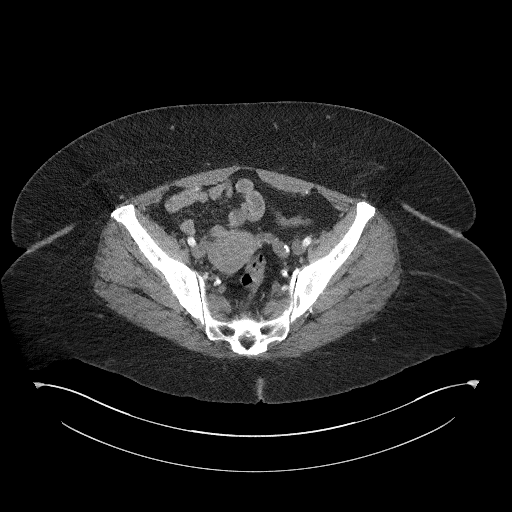
[im 111/241  soft-tissue]
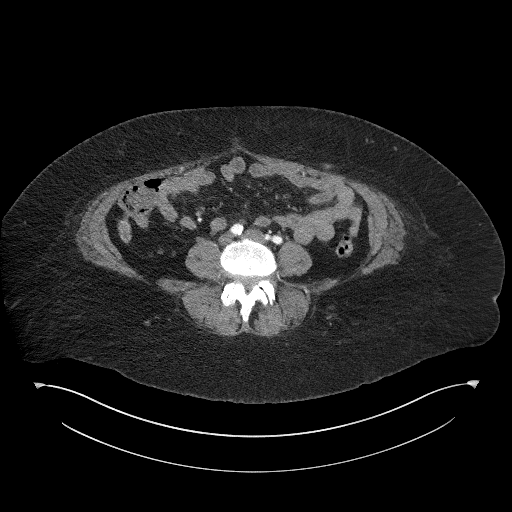
[im 130/241  soft-tissue]
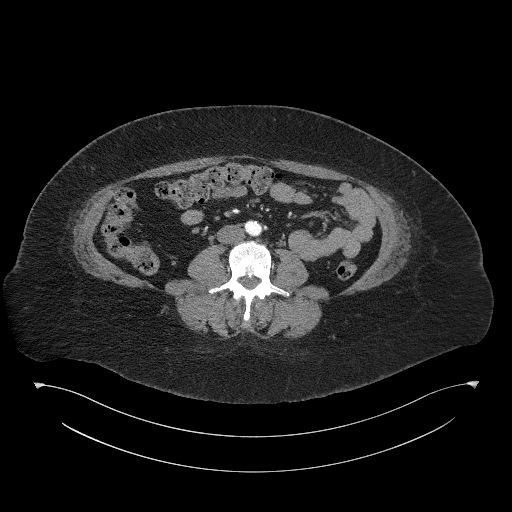
[im 167/241  soft-tissue]
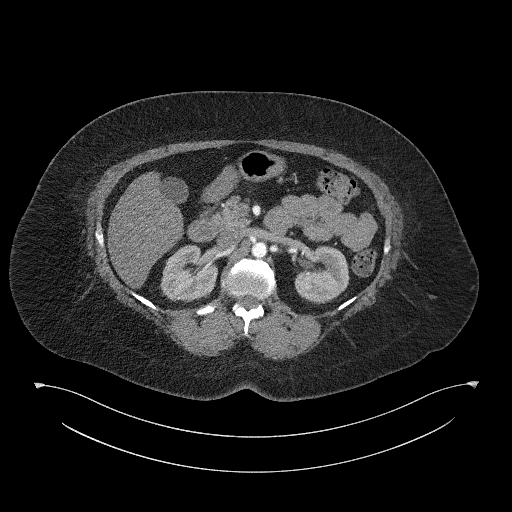

[Series 8: coronals · coronal · 0.78mm/px · 1 of 156 slices shown, 2 images]
[im 78/156  soft-tissue]
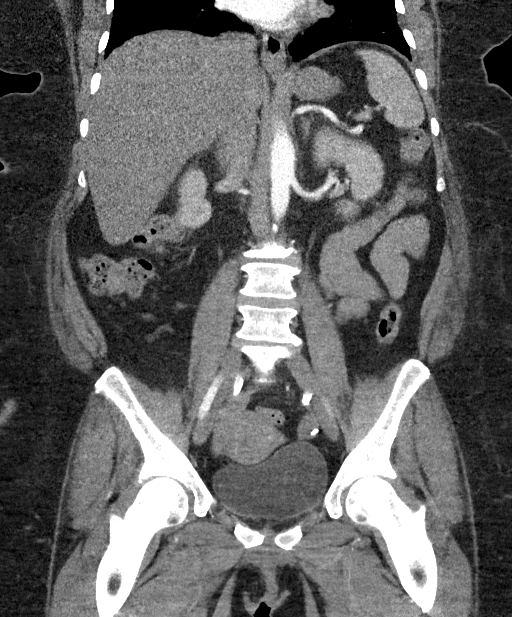
[im 78/156  bone]
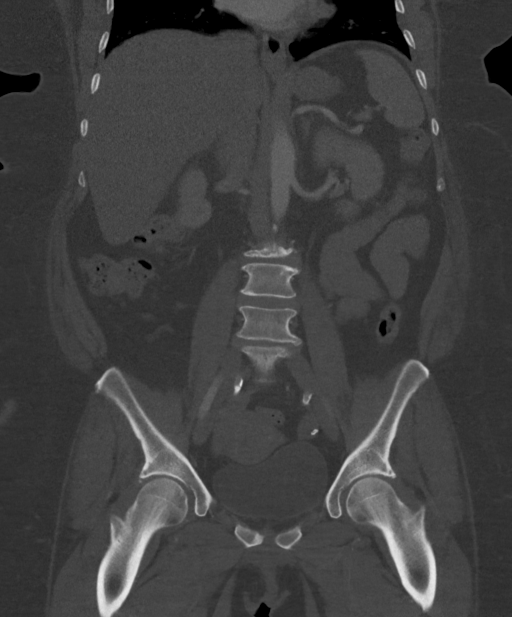

[Series 12: axial portal (person_name) · axial · portal-venous · 0.89mm/px · z∈[+1123,+1408]mm · 4 of 97 slices shown, 9 images]
[im 20/97  soft-tissue]
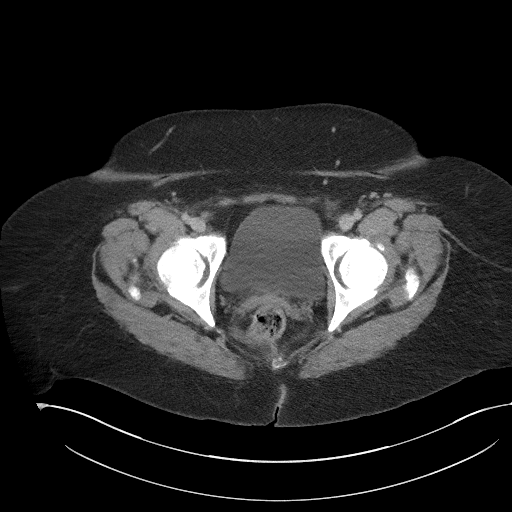
[im 20/97  lung]
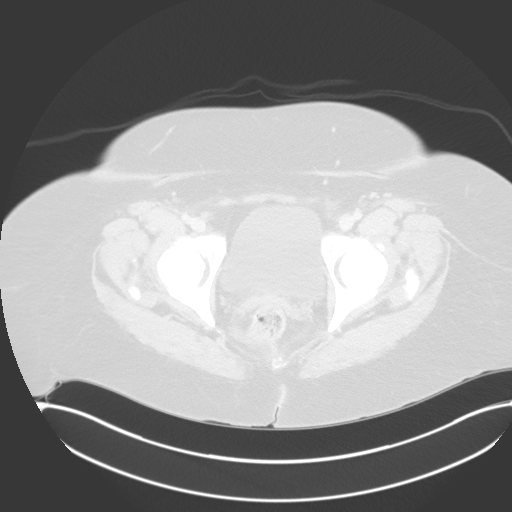
[im 20/97  bone]
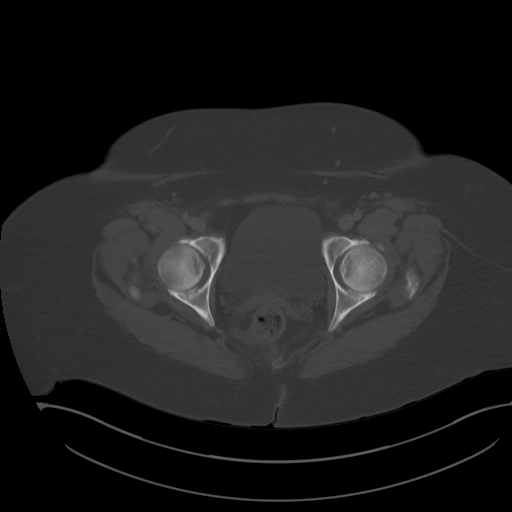
[im 39/97  soft-tissue]
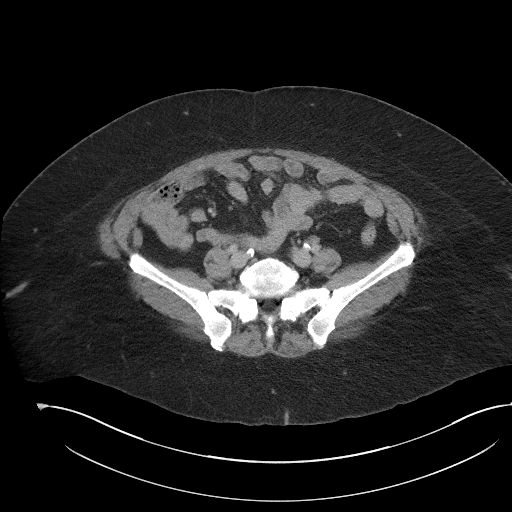
[im 39/97  lung]
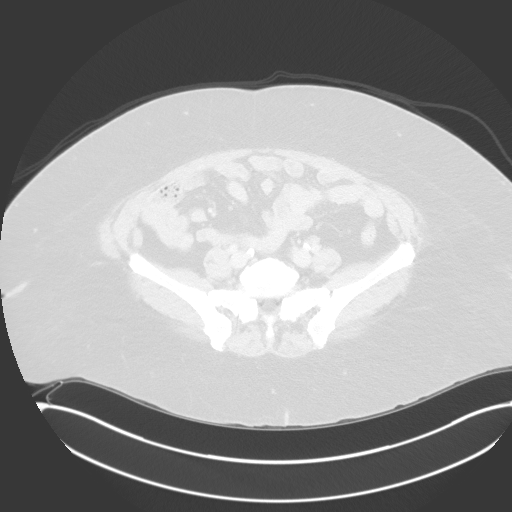
[im 58/97  soft-tissue]
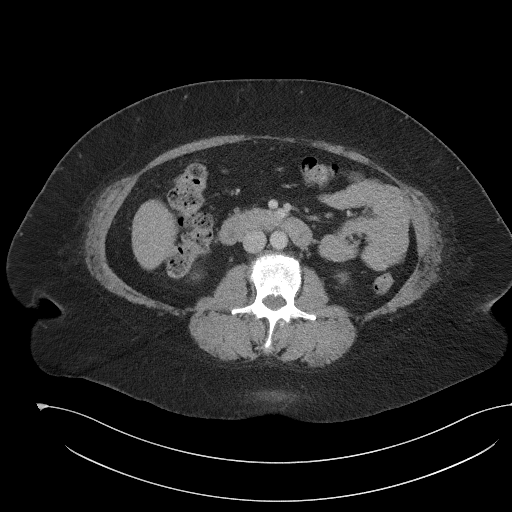
[im 58/97  lung]
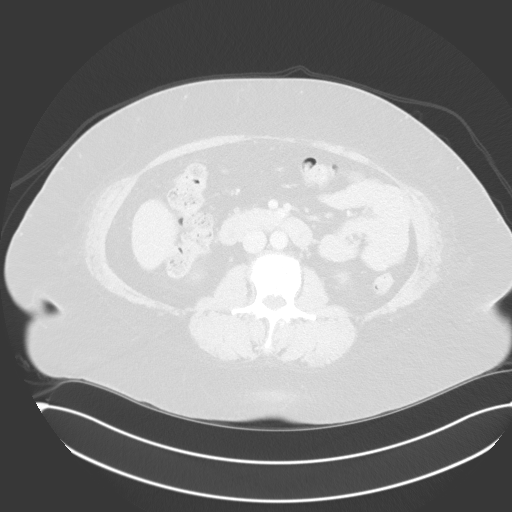
[im 77/97  soft-tissue]
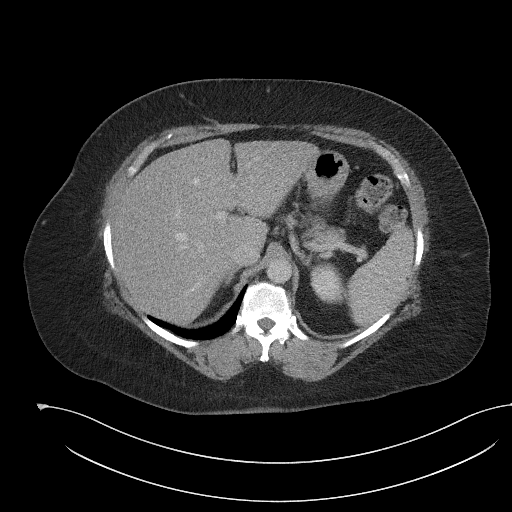
[im 77/97  lung]
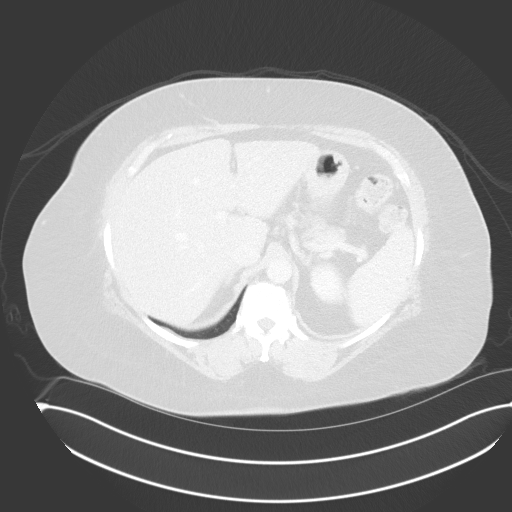

[11 of 46 positions shown; findings below may reference images not displayed]

FINDINGS: VASCULAR

Aorta: There are atherosclerotic changes throughout the abdominal
aorta without evidence for an aneurysm or dissection.

Celiac: Patent without evidence of aneurysm, dissection, vasculitis
or significant stenosis.

SMA: Patent without evidence of aneurysm, dissection, vasculitis or
significant stenosis.

Renals: Both renal arteries are patent without evidence of aneurysm,
dissection, vasculitis, fibromuscular dysplasia or significant
stenosis.

IMA: Patent without evidence of aneurysm, dissection, vasculitis or
significant stenosis.

Inflow: Patent without evidence of aneurysm, dissection, vasculitis
or significant stenosis.

Proximal Outflow: Bilateral common femoral and visualized portions
of the superficial and profunda femoral arteries are patent without
evidence of aneurysm, dissection, vasculitis or significant
stenosis.

Veins: No obvious venous abnormality within the limitations of this
arterial phase study.

Review of the MIP images confirms the above findings.

NON-VASCULAR

Lower chest: The lung bases are clear. The heart size is normal.

Hepatobiliary: The liver is normal. Normal gallbladder.There is no
biliary ductal dilation.

Pancreas: Normal contours without ductal dilatation. No
peripancreatic fluid collection.

Spleen: Unremarkable.

Adrenals/Urinary Tract:

--Adrenal glands: Unremarkable.

--Right kidney/ureter: No hydronephrosis or radiopaque kidney
stones.

--Left kidney/ureter: No hydronephrosis or radiopaque kidney stones.

--Urinary bladder: Unremarkable.

Stomach/Bowel:

--Stomach/Duodenum: No hiatal hernia or other gastric abnormality.
Normal duodenal course and caliber.

--Small bowel: Unremarkable.

--Colon: There are metallic clips at the level of the cecum. There
are few scattered colonic diverticula.

--Appendix: Normal.

Lymphatic:

--No retroperitoneal lymphadenopathy.

--No mesenteric lymphadenopathy.

--No pelvic or inguinal lymphadenopathy.

Reproductive: Unremarkable

Other: No ascites or free air. The abdominal wall is normal.

Musculoskeletal. No acute displaced fractures. There is probable
spinal canal stenosis at the L4-L5 level secondary to a posterior
disc osteophyte complex in addition to ligamentum flavum hypertrophy
and grade 1 anterolisthesis of L4 on L5.
IMPRESSION: 1. No findings to explain the patient's anemia or melena.
2. Scattered colonic diverticula without CT evidence for
diverticulitis.
3. There are metallic clips at the level of the cecum. Correlation
with history of colonoscopy is recommended.

Aortic Atherosclerosis ([id number]-0L0.0).
